# Patient Record
Sex: Male | Born: 1970 | Race: Black or African American | Hispanic: No | Marital: Single | Smoking: Never smoker
Health system: Southern US, Community
[De-identification: ages and names within clinical notes are randomized; demographics above are authoritative.]

## PROBLEM LIST (undated history)

## (undated) ENCOUNTER — Emergency Department (HOSPITAL_COMMUNITY): Admission: EM | Payer: Self-pay | Source: Home / Self Care

## (undated) ENCOUNTER — Emergency Department (HOSPITAL_COMMUNITY): Disposition: A | Discharge status: Home / Self Care | Payer: Self-pay

## (undated) DIAGNOSIS — I82409 Acute embolism and thrombosis of unspecified deep veins of unspecified lower extremity: Secondary | ICD-10-CM

## (undated) DIAGNOSIS — I10 Essential (primary) hypertension: Secondary | ICD-10-CM

---

## 2002-07-17 ENCOUNTER — Emergency Department (HOSPITAL_COMMUNITY): Admission: EM | Admit: 2002-07-17 | Discharge: 2002-07-17 | Payer: Self-pay | Admitting: Emergency Medicine

## 2002-07-17 ENCOUNTER — Encounter: Payer: Self-pay | Admitting: Emergency Medicine

## 2002-12-24 ENCOUNTER — Emergency Department (HOSPITAL_COMMUNITY): Admission: EM | Admit: 2002-12-24 | Discharge: 2002-12-24 | Payer: Self-pay | Admitting: Emergency Medicine

## 2003-10-30 ENCOUNTER — Emergency Department (HOSPITAL_COMMUNITY): Admission: EM | Admit: 2003-10-30 | Discharge: 2003-10-30 | Payer: Self-pay | Admitting: Family Medicine

## 2004-06-10 ENCOUNTER — Emergency Department (HOSPITAL_COMMUNITY): Admission: EM | Admit: 2004-06-10 | Discharge: 2004-06-10 | Payer: Self-pay | Admitting: Family Medicine

## 2004-06-20 ENCOUNTER — Emergency Department (HOSPITAL_COMMUNITY): Admission: EM | Admit: 2004-06-20 | Discharge: 2004-06-20 | Payer: Self-pay | Admitting: Family Medicine

## 2004-08-09 ENCOUNTER — Emergency Department (HOSPITAL_COMMUNITY): Admission: EM | Admit: 2004-08-09 | Discharge: 2004-08-09 | Payer: Self-pay | Admitting: Emergency Medicine

## 2004-08-18 ENCOUNTER — Ambulatory Visit: Payer: Self-pay | Admitting: Internal Medicine

## 2005-04-21 ENCOUNTER — Emergency Department (HOSPITAL_COMMUNITY): Admission: EM | Admit: 2005-04-21 | Discharge: 2005-04-21 | Payer: Self-pay | Admitting: Emergency Medicine

## 2005-05-29 ENCOUNTER — Emergency Department (HOSPITAL_COMMUNITY): Admission: EM | Admit: 2005-05-29 | Discharge: 2005-05-29 | Payer: Self-pay | Admitting: Emergency Medicine

## 2006-07-22 ENCOUNTER — Ambulatory Visit: Payer: Self-pay | Admitting: Family Medicine

## 2006-07-22 ENCOUNTER — Observation Stay (HOSPITAL_COMMUNITY): Admission: EM | Admit: 2006-07-22 | Discharge: 2006-07-23 | Payer: Self-pay | Admitting: Emergency Medicine

## 2006-10-30 ENCOUNTER — Emergency Department (HOSPITAL_COMMUNITY): Admission: EM | Admit: 2006-10-30 | Discharge: 2006-10-30 | Payer: Self-pay | Admitting: Emergency Medicine

## 2006-11-20 ENCOUNTER — Emergency Department (HOSPITAL_COMMUNITY): Admission: EM | Admit: 2006-11-20 | Discharge: 2006-11-20 | Payer: Self-pay | Admitting: Family Medicine

## 2007-04-07 ENCOUNTER — Emergency Department (HOSPITAL_COMMUNITY): Admission: EM | Admit: 2007-04-07 | Discharge: 2007-04-07 | Payer: Self-pay | Admitting: Emergency Medicine

## 2009-09-21 ENCOUNTER — Emergency Department (HOSPITAL_COMMUNITY): Admission: EM | Admit: 2009-09-21 | Discharge: 2009-09-21 | Payer: Self-pay | Admitting: Emergency Medicine

## 2009-10-21 ENCOUNTER — Emergency Department (HOSPITAL_COMMUNITY): Admission: EM | Admit: 2009-10-21 | Discharge: 2009-10-21 | Payer: Self-pay | Admitting: Emergency Medicine

## 2010-09-17 NOTE — Discharge Summary (Signed)
NAME:  Edward Barr, MADDY NO.:  [id number]   MEDICAL RECORD NO.:  [id number]          PATIENT TYPE:  OBV   LOCATION:  6741                         FACILITY:  [facility]   PHYSICIAN:  Dwana Curd. Para March, M.D. DATE OF BIRTH:  04/02/1971   DATE OF ADMISSION:  07/22/2006  DATE OF DISCHARGE:  07/23/2006                               DISCHARGE SUMMARY   DISCHARGE DIAGNOSIS:  Cocaine-induced chest pain.   DISCHARGE MEDICATIONS:  None.   FOLLOWUP:  Patient is to follow up with [facility].  He did not have a  primary care Edward Barr at time of admission, he was unassigned and wished  to follow up with [facility].  He voices understanding of the need for  followup.   At followup, patient needs to have his blood pressure checked.  He had  borderline LVH on EKG, this may be a normal variant of an athletic heart  in a young African-American male.  He also had an LDL of 111, this can  be followed up at [facility] for dietary modification.   PROCEDURES AND DIAGNOSTIC STUDIES:  EKG showing borderline LVH, no acute  ST changes.   CONSULTANTS:  None.   ADMISSION HISTORY AND PHYSICAL:  The patient is a 40 year old African-  American male with no past medical history who endorses cocaine use  several hours before admission.  He had left breast pain and shortness  of breath after using cocaine.  This subsequently resolved.  He was  given aspirin, but never required nitroglycerin.  At time of intake in  the emergency room by the family medical teaching service, he was chest  pain free and he was admitted for management and rule out MI with serial  enzymes and EKG.   HOSPITAL COURSE:  Patient was admitted as listed above.  He was stable  on telemetry overnight.  He had serial troponins that were negative.  He  had no significant EKG changes and he remained chest pain free.  He is  stable for followup.   DISCHARGE LABS:  Alcohol level negative.  UDS positive for cocaine.   Initial  point-of-care cardiac markers were negative.  First formal set  of cardiac markers showed a troponin of 0.02.  Followup troponin was  unchanged.   Discharge electrolytes:  Sodium 137, potassium 3.7, creatinine 0.99.   Lipids:  Cholesterol 170, triglycerides 83, HDL 42, LDL 111.   Hemoglobin 13.3, platelets 362.      Dwana Curd Para March, M.D.     GSD/MEDQ  D:  07/23/2006  T:  07/23/2006  Job:  [id number]   cc:   [facility]

## 2010-09-17 NOTE — H&P (Signed)
Edward Barr, Edward Barr                 ACCOUNT NO.:  [id number]   MEDICAL RECORD NO.:  [id number]          PATIENT TYPE:  OBV   LOCATION:  1830                         FACILITY:  [facility]   PHYSICIAN:  Leighton Roach McDiarmid, M.D.DATE OF BIRTH:  [birth year]   DATE OF ADMISSION:  07/22/2006  DATE OF DISCHARGE:                              HISTORY & PHYSICAL   CHIEF COMPLAINT:  Chest pain.   PRIMARY CARE PHYSICIAN:  None.   HISTORY OF PRESENT ILLNESS:  This is a 40 year old African American male  with no known past medical history who presents to the emergency  department for sudden onset left-sided, sharp, stabbing chest pain that  began around 11 this morning after getting out of the shower.  The pain  lasted until arrival in the ED, and receipt of aspirin around 2 p.m.  this afternoon.  The pain is resolved at the time of my exam.  The pain  was described as non-radiating, but it was associated with nausea,  diaphoresis, and shortness of breath.  It was worsened by swallowing and  taking deep breaths, and the patient's chest was tender to palpation  while the chest pain was ongoing, however, this has since resolved.  The  patient denies any prior episodes of chest pain similar to this.  He  does report occasional heartburn but this pain is different.  Of note,  the patient also admits to occasional snorting of cocaine with last use  being yesterday.  The patient denies any fever, chills, cough, recent  illness, vomiting, diarrhea, or abdominal pain.   REVIEW OF SYSTEMS:  The patient denies fever, chills, cough, sore  throat, congestion, denies headache and changes, numbness, tingling. He  denies any abdominal pain, vomiting or diarrhea.  He denies bright red  blood per rectum or melenic stools.  He denies dysuria or hematuria.  He  denies rash.   PAST MEDICAL HISTORY:  None.   PAST SURGICAL HISTORY:  None.   MEDICATIONS:  None.   ALLERGIES:  NO KNOWN DRUG ALLERGIES.  THE PATIENT DOES  REPORT  ANAPHYLACTIC REACTION TO GARLIC AND LEMON PEPPER.   FAMILY HISTORY:  The patient's mother had an MI in her 33s, also has  diabetes, hypertension.  The patient's father had an MI in his 25s, is  still living.  The patient's brother is alive and well.  The patient has  multiple grandparents who also had massive MIs.   SOCIAL HISTORY:  The patient currently lives with a male friend.  He  is unemployed.  Denies tobacco use.  Reports occasional alcohol use  socially with last drink being yesterday, which was one shot of vodka.  Admits to using cocaine several times per month with his last use also  being yesterday.  He reports snorting powder but denies any crack  rejection use.  The patient also denies any other recreational drug use.   PHYSICAL EXAMINATION:  VITAL SIGNS:  Temperature is 99.2, heart rate  106, respiratory rate 18, blood pressure 143/84, oxygen saturation 98-  100% on room air.  GENERAL:  The patient is awake,  alert, and in no acute distress.  HEENT:  Head is normocephalic and atraumatic.  Pupils are equal, round,  reactive to light and accommodation.  Extraocular movements are intact.  Ears are without discharge or deformity.  Oropharynx is non-erythematous  and moist mucous membranes.  Tympanic membranes are clear bilaterally.  NECK:  Supple, nontender and no cervical, supraclavicular or occipital  lymphadenopathy.  CARDIOVASCULAR:  Heart is regular rate and rhythm with no murmurs, rubs  or gallops.  The patient has two plus dorsalis pedis pulses bilaterally,  with a normally placed PMI, and brisk cap refill.  LUNGS:  Clear to auscultation bilaterally with normal work of breathing  and good air movement.  ABDOMEN:  Normoactive bowel sounds, soft, nontender, nondistended.  EXTREMITIES:  Have no clubbing, cyanosis or edema are warm and well  perfused.  NEUROLOGIC:  Cranial nerves II-XII are grossly intact.  The patient has  5/5 strength bilaterally in his  extremities.  Cerebellar function is  intact.  SKIN:  No rash or petechiae.  RECTAL:  The patient refuses until after eating.   LABORATORY DATA:  Point of care, revealed a myoglobin of 58.5, CK-MB of  less than 1, troponin of less than 0.05.  Urine drug screen is positive  for cocaine but otherwise negative.  Alcohol level is less than 5.  I-  STAT reveals a sodium of 137, potassium of 3.9, chloride of 104, bicarb  25.8, BUN of 12, creatinine of 1, glucose of 87.  Hemoglobin is 15.3 and  hematocrit 45.0 on I-STAT.   Chest x-ray shows no acute cardiopulmonary disease.   EKG shows normal sinus rhythm with no acute ST or T-wave changes but  presence of left ventricular hypertrophy.   ASSESSMENT:  This is a 40 year old African American male with no known  medical history who presents with first episode of sudden onset left-  sided chest pain.   PLAN:  1. Chest pain.  It is left-sided, now resolved.  However, given recent      cocaine use and family history is concerning somewhat for cardiac      origin.  First set of cardiac enzymes are negative and EKG is      without evidence of acute ischemia.  Thus coronary vasospasm      secondary to cocaine or esophageal spasm versus heartburn are also      possible.  We will admit the patient for 23-hour observation to      rule out MI.  We will obtain cardiac panel now and again in the      morning, 12 hours later, along with a repeat EKG in a.m.  We will      also obtain fasting lipid panel in the morning to risk stratify.      We will start aspirin 81 mg p.o. daily as the patient has already      received aspirin load in the ED.  We will avoid beta blocker use      given positive urine drug screen for cocaine.  We will give      nitroglycerin p.r.n. for chest pain.  2. Blood pressure.  The patient has a single mildly elevated blood      pressure in the emergency department while he was in pain.  The     patient has no history of  hypertension, though has not been to a      doctor in years.  We will continue to monitor blood pressures  throughout admission and recommend establishing with a primary care      physician upon discharge.  3. FEN/GI:  We will place the patient on a heart-healthy diet as      tolerated.  We will KVO IV fluids.  4. Prophylaxis.  We will place the patient on SCDs.   DISPOSITION:  Pending cardiac rule out.  We will offer to establish the  patient at [facility] or [facility] for primary care physician at the  time of discharge.     ______________________________  Drue Dun, M.D.    ______________________________  Leighton Roach McDiarmid, M.D.    EE/MEDQ  D:  07/22/2006  T:  07/22/2006  Job:  [id number]

## 2013-12-24 ENCOUNTER — Emergency Department (HOSPITAL_COMMUNITY)
Admission: EM | Admit: 2013-12-24 | Discharge: 2013-12-24 | Disposition: A | Discharge status: Home / Self Care | Payer: Self-pay | Attending: Emergency Medicine | Admitting: Emergency Medicine

## 2013-12-24 ENCOUNTER — Encounter (HOSPITAL_COMMUNITY): Payer: Self-pay | Admitting: Emergency Medicine

## 2013-12-24 DIAGNOSIS — K029 Dental caries, unspecified: Secondary | ICD-10-CM | POA: Insufficient documentation

## 2013-12-24 DIAGNOSIS — K089 Disorder of teeth and supporting structures, unspecified: Secondary | ICD-10-CM | POA: Insufficient documentation

## 2013-12-24 DIAGNOSIS — K047 Periapical abscess without sinus: Secondary | ICD-10-CM | POA: Insufficient documentation

## 2013-12-24 DIAGNOSIS — K002 Abnormalities of size and form of teeth: Secondary | ICD-10-CM | POA: Insufficient documentation

## 2013-12-24 MED ORDER — HYDROCODONE-ACETAMINOPHEN 5-325 MG PO TABS
1.0000 | ORAL_TABLET | Freq: Once | ORAL | Status: AC
  Administered 2013-12-24: 1 via ORAL
  Filled 2013-12-24: qty 1

## 2013-12-24 MED ORDER — IBUPROFEN 800 MG PO TABS: 800.0000 mg | ORAL_TABLET | Freq: Three times a day (TID) | ORAL | Status: DC | PRN

## 2013-12-24 MED ORDER — HYDROCODONE-ACETAMINOPHEN 5-325 MG PO TABS: 1.0000 | ORAL_TABLET | Freq: Four times a day (QID) | ORAL | Status: DC | PRN

## 2013-12-24 MED ORDER — PENICILLIN V POTASSIUM 500 MG PO TABS: 500.0000 mg | ORAL_TABLET | Freq: Four times a day (QID) | ORAL | Status: DC

## 2013-12-24 NOTE — ED Notes (Signed)
C/o of dental pain to left lower mouth x 3 days

## 2013-12-24 NOTE — Discharge Instructions (Signed)
Return here as needed.  Call the dentist provided for followup.  There was no oral surgeon on call today for the emergency department.rinse with warm water and peroxide 3 times a day

## 2013-12-24 NOTE — ED Provider Notes (Signed)
CSN: [id number]     Arrival date & time 12/24/13  0913 History   First MD Initiated Contact with Patient 12/24/13 1005     Chief Complaint  Patient presents with  . Dental Pain     (Consider location/radiation/quality/duration/timing/severity/associated sxs/prior Treatment) HPI Patient presents to the emergency department with dental pain to the left lower dentition for the last 3 days.  The patient, states, that his lower left back molar has been bothering him with swelling and bleeding from the area.  The patient, states, that he has been rinsing with Listerine for the last few days.  Patient, states, that he has not any fever, nausea, vomiting, weakness, throat swelling. Or syncope.  States, that he does not have a dentist History reviewed. No pertinent past medical history. History reviewed. No pertinent past surgical history. No family history on file. History  Substance Use Topics  . Smoking status: Never Smoker   . Smokeless tobacco: Not on file  . Alcohol Use: No    Review of Systems  All other systems negative except as documented in the HPI. All pertinent positives and negatives as reviewed in the HPI.   Allergies  Review of patient's allergies indicates no known allergies.  Home Medications   Prior to Admission medications   Not on File   BP 122/75  Pulse 60  Temp(Src) 98 F (36.7 C) (Oral)  Resp 16  Wt 156 lb (70.761 kg)  SpO2 99% Physical Exam  Nursing note and vitals reviewed. Constitutional: He is oriented to person, place, and time. He appears well-developed and well-nourished. No distress.  HENT:  Head: Normocephalic and atraumatic.  Mouth/Throat: Uvula is midline. No trismus in the jaw. Abnormal dentition. Dental abscesses and dental caries present. No uvula swelling. No posterior oropharyngeal edema or posterior oropharyngeal erythema.    Neck: Normal range of motion. Neck supple. No muscular tenderness present. No rigidity. No edema and normal  range of motion present.  Pulmonary/Chest: Effort normal.  Neurological: He is alert and oriented to person, place, and time.  Skin: Skin is warm and dry.    ED Course  Procedures (including critical care time)   Patient will be referred to the oral surgeon and dentist for further evaluation and care.  Patient is advised return here as needed.  Advised to rinse with warm water and peroxide 3 times a day    Carlyle Dolly, PA-C 12/24/13 1054

## 2013-12-27 NOTE — ED Provider Notes (Signed)
Medical screening examination/treatment/procedure(s) were performed by non-physician practitioner and as supervising physician I was immediately available for consultation/collaboration.   Krzysztof Reichelt T Caeden Foots, MD 12/27/13 0741 

## 2014-01-25 ENCOUNTER — Emergency Department (HOSPITAL_COMMUNITY)
Admission: EM | Admit: 2014-01-25 | Discharge: 2014-01-25 | Disposition: A | Discharge status: Home / Self Care | Payer: Self-pay | Attending: Emergency Medicine | Admitting: Emergency Medicine

## 2014-01-25 ENCOUNTER — Encounter (HOSPITAL_COMMUNITY): Payer: Self-pay | Admitting: Emergency Medicine

## 2014-01-25 DIAGNOSIS — K0889 Other specified disorders of teeth and supporting structures: Secondary | ICD-10-CM

## 2014-01-25 DIAGNOSIS — Z792 Long term (current) use of antibiotics: Secondary | ICD-10-CM | POA: Insufficient documentation

## 2014-01-25 DIAGNOSIS — K089 Disorder of teeth and supporting structures, unspecified: Secondary | ICD-10-CM | POA: Insufficient documentation

## 2014-01-25 MED ORDER — PENICILLIN V POTASSIUM 500 MG PO TABS
500.0000 mg | ORAL_TABLET | Freq: Once | ORAL | Status: AC
  Administered 2014-01-25: 500 mg via ORAL
  Filled 2014-01-25: qty 1

## 2014-01-25 MED ORDER — PROMETHAZINE HCL 25 MG PO TABS: 25.0000 mg | ORAL_TABLET | Freq: Four times a day (QID) | ORAL | Status: DC | PRN

## 2014-01-25 MED ORDER — HYDROCODONE-ACETAMINOPHEN 5-325 MG PO TABS: 1.0000 | ORAL_TABLET | Freq: Four times a day (QID) | ORAL | Status: DC | PRN

## 2014-01-25 MED ORDER — PENICILLIN V POTASSIUM 500 MG PO TABS: 500.0000 mg | ORAL_TABLET | Freq: Four times a day (QID) | ORAL | Status: DC

## 2014-01-25 MED ORDER — HYDROCODONE-ACETAMINOPHEN 5-325 MG PO TABS
2.0000 | ORAL_TABLET | Freq: Once | ORAL | Status: AC
  Administered 2014-01-25: 2 via ORAL
  Filled 2014-01-25: qty 2

## 2014-01-25 MED ORDER — ONDANSETRON 8 MG PO TBDP
8.0000 mg | ORAL_TABLET | Freq: Once | ORAL | Status: AC
  Administered 2014-01-25: 8 mg via ORAL
  Filled 2014-01-25: qty 1

## 2014-01-25 NOTE — Discharge Instructions (Signed)
Dental Pain °A tooth ache may be caused by cavities (tooth decay). Cavities expose the nerve of the tooth to air and hot or cold temperatures. It may come from an infection or abscess (also called a boil or furuncle) around your tooth. It is also often caused by dental caries (tooth decay). This causes the pain you are having. °DIAGNOSIS  °Your caregiver can diagnose this problem by exam. °TREATMENT  °· If caused by an infection, it may be treated with medications which kill germs (antibiotics) and pain medications as prescribed by your caregiver. Take medications as directed. °· Only take over-the-counter or prescription medicines for pain, discomfort, or fever as directed by your caregiver. °· Whether the tooth ache today is caused by infection or dental disease, you should see your dentist as soon as possible for further care. °SEEK MEDICAL CARE IF: °The exam and treatment you received today has been provided on an emergency basis only. This is not a substitute for complete medical or dental care. If your problem worsens or new problems (symptoms) appear, and you are unable to meet with your dentist, call or return to this location. °SEEK IMMEDIATE MEDICAL CARE IF:  °· You have a fever. °· You develop redness and swelling of your face, jaw, or neck. °· You are unable to open your mouth. °· You have severe pain uncontrolled by pain medicine. °MAKE SURE YOU:  °· Understand these instructions. °· Will watch your condition. °· Will get help right away if you are not doing well or get worse. °Document Released: 04/18/2005 Document Revised: 07/11/2011 Document Reviewed: 12/05/2007 °Exit[employer]® Patient Information ©2015 [employer]. This information is not intended to replace advice given to you by your health care provider. Make sure you discuss any questions you have with your health care provider. °Return to the emergency room for fever, change in vision, redness to the face that rapidly spreads towards the eye,  nausea or vomiting, difficulty swallowing or shortness of breath. ° °You may follow with the dentist listed above. Alternatively,  you may also contact [facility] who run a low-cost dental clinic at their phone number is [phone]. You may also call [phone] ° °Dental Assistance °If the dentist on-call cannot see you, please use the resources below: ° ° °Patients with Medicaid: [facility] °[address], [phone] °[address], [phone] ° °If unable to pay, or uninsured, contact [facility] ([phone]) or [facility] ([phone] in [place], [phone] in [place]) to become qualified for the adult dental clinic ° °Other Low-Cost Community Dental Services: °[facility]- [address], [place], [place], [address] °   [phone], Ext. [phone] °   2nd and 4th Thursday of the month at 6:30am °   10 clients each day by appointment, can sometimes see walk-in     patients if someone does not show for an appointment °[facility]- [address], [place], [place], [address] °   [phone] °[facility]- [address], [place], [place], [address] °   [phone] ° °[facility]- [phone] °[facility]- [phone] °[facility]- [phone] ° °

## 2014-01-25 NOTE — ED Provider Notes (Signed)
CSN: [id number]     Arrival date & time 01/25/14  1229 History   First MD Initiated Contact with Patient 01/25/14 1255     Chief Complaint  Patient presents with  . Dental Pain     (Consider location/radiation/quality/duration/timing/severity/associated sxs/prior Treatment) HPI Comments: Patient is a 43 year old male who presents emergency department for evaluation of dental pain. He reports that he was initially seen for the same dental pain one month ago. He was given penicillin and a pain remainder. Initially this improved his symptoms, but he was unable to follow up with a dentist due to financial reasons. He tried to go to the free dental clinic, but arrived too late and was not able to be seen. One week ago he has been having gradually worsening left lower dental pain. He describes the pain as throbbing. He has taken [facility] powder without relief of his symptoms. He denies fever, chills, nausea, vomiting, abdominal pain, trismus, shortness of breath.  Patient is a 43 y.o. male presenting with tooth pain. The history is provided by the patient. No language interpreter was used.  Dental Pain Associated symptoms: no fever     History reviewed. No pertinent past medical history. History reviewed. No pertinent past surgical history. No family history on file. History  Substance Use Topics  . Smoking status: Never Smoker   . Smokeless tobacco: Not on file  . Alcohol Use: No    Review of Systems  Constitutional: Negative for fever and chills.  HENT: Positive for dental problem.   Gastrointestinal: Negative for nausea, vomiting and abdominal pain.  All other systems reviewed and are negative.     Allergies  Review of patient's allergies indicates no known allergies.  Home Medications   Prior to Admission medications   Medication Sig Start Date End Date Taking? Authorizing Provider  HYDROcodone-acetaminophen (NORCO/VICODIN) 5-325 MG per tablet Take 1 tablet by mouth every 6 (six)  hours as needed for moderate pain. 12/24/13   Carlyle Dolly, PA-C  HYDROcodone-acetaminophen (NORCO/VICODIN) 5-325 MG per tablet Take 1-2 tablets by mouth every 6 (six) hours as needed for moderate pain or severe pain. 01/25/14   Mora Bellman, PA-C  ibuprofen (ADVIL,MOTRIN) 200 MG tablet Take 200 mg by mouth every 4 (four) hours as needed for moderate pain.    Historical Provider, MD  ibuprofen (ADVIL,MOTRIN) 800 MG tablet Take 1 tablet (800 mg total) by mouth every 8 (eight) hours as needed. 12/24/13   Jamesetta Orleans Lawyer, PA-C  naproxen sodium (ANAPROX) 220 MG tablet Take 220 mg by mouth once as needed (dental pain.).    Historical Provider, MD  penicillin v potassium (VEETID) 500 MG tablet Take 1 tablet (500 mg total) by mouth 4 (four) times daily. 12/24/13   Jamesetta Orleans Lawyer, PA-C  penicillin v potassium (VEETID) 500 MG tablet Take 1 tablet (500 mg total) by mouth 4 (four) times daily. 01/25/14   Mora Bellman, PA-C  promethazine (PHENERGAN) 25 MG tablet Take 1 tablet (25 mg total) by mouth every 6 (six) hours as needed for nausea or vomiting. 01/25/14   Mora Bellman, PA-C   BP 123/76  Pulse 56  Temp(Src) 97.9 F (36.6 C) (Oral)  Resp 18  SpO2 98% Physical Exam  Nursing note and vitals reviewed. Constitutional: He is oriented to person, place, and time. He appears well-developed and well-nourished. No distress.  HENT:  Head: Normocephalic and atraumatic.  Right Ear: External ear normal.  Left Ear: External ear normal.  Nose: Nose  normal.  No trismus, submental edema, tongue elevation Tender to palpation to left lower molar.  No drainable abscess.   Eyes: Conjunctivae are normal.  Neck: Normal range of motion. No tracheal deviation present.  Cardiovascular: Normal rate, regular rhythm and normal heart sounds.   Pulmonary/Chest: Effort normal and breath sounds normal. No stridor.  Abdominal: Soft. He exhibits no distension. There is no tenderness.  Musculoskeletal:  Normal range of motion.  Neurological: He is alert and oriented to person, place, and time.  Skin: Skin is warm and dry. He is not diaphoretic.  Psychiatric: He has a normal mood and affect. His behavior is normal.    ED Course  Procedures (including critical care time) Labs Review Labs Reviewed - No data to display  Imaging Review No results found.   EKG Interpretation None      MDM   Final diagnoses:  Pain, dental   Patient with toothache.  No gross abscess.  Exam unconcerning for Ludwig's angina or spread of infection.  Will treat with penicillin and pain medicine.  Urged patient to follow-up with dentist.    Mora Bellman, PA-C 01/25/14 1319

## 2014-01-25 NOTE — ED Provider Notes (Signed)
Medical screening examination/treatment/procedure(s) were performed by non-physician practitioner and as supervising physician I was immediately available for consultation/collaboration.   EKG Interpretation None       Gilda Crease, MD 01/25/14 1324

## 2014-01-25 NOTE — ED Notes (Signed)
Pt reports having left, posterior, mandibular dental pain that has started on week ago. Pt also states that he has an abscess at the site. Pt states that he was seen here about one month ago for the same, however hasn't followed up with a dentist due to financial reasons. Pt is A/O x4, in NAD, and vitals are WDL.

## 2014-02-20 ENCOUNTER — Emergency Department (INDEPENDENT_AMBULATORY_CARE_PROVIDER_SITE_OTHER)
Admission: EM | Admit: 2014-02-20 | Discharge: 2014-02-20 | Disposition: A | Discharge status: Home / Self Care | Payer: Self-pay | Source: Home / Self Care | Attending: Emergency Medicine | Admitting: Emergency Medicine

## 2014-02-20 ENCOUNTER — Encounter (HOSPITAL_COMMUNITY): Payer: Self-pay | Admitting: Emergency Medicine

## 2014-02-20 DIAGNOSIS — K0401 Reversible pulpitis: Secondary | ICD-10-CM

## 2014-02-20 DIAGNOSIS — K040 Pulpitis: Secondary | ICD-10-CM

## 2014-02-20 MED ORDER — HYDROCODONE-ACETAMINOPHEN 5-325 MG PO TABS: ORAL_TABLET | ORAL | Status: DC

## 2014-02-20 MED ORDER — METRONIDAZOLE 500 MG PO TABS
500.0000 mg | ORAL_TABLET | Freq: Three times a day (TID) | ORAL | Status: DC
Start: 2014-02-20 — End: 2014-04-10

## 2014-02-20 MED ORDER — AMOXICILLIN 500 MG PO CAPS: 500.0000 mg | ORAL_CAPSULE | Freq: Three times a day (TID) | ORAL | Status: DC

## 2014-02-20 MED ORDER — HYDROCODONE-ACETAMINOPHEN 5-325 MG PO TABS: 2.0000 | ORAL_TABLET | Freq: Once | ORAL | Status: DC

## 2014-02-20 MED ORDER — HYDROCODONE-ACETAMINOPHEN 5-325 MG PO TABS
2.0000 | ORAL_TABLET | Freq: Once | ORAL | Status: DC
  Administered 2014-02-20: 2 via ORAL

## 2014-02-20 MED ORDER — HYDROCODONE-ACETAMINOPHEN 5-325 MG PO TABS
1.0000 | ORAL_TABLET | Freq: Once | ORAL | Status: AC
  Administered 2014-02-20: 1 via ORAL

## 2014-02-20 MED ORDER — HYDROCODONE-ACETAMINOPHEN 5-325 MG PO TABS
ORAL_TABLET | ORAL | Status: AC
  Filled 2014-02-20: qty 2

## 2014-02-20 MED ORDER — NAPROXEN 500 MG PO TABS: 500.0000 mg | ORAL_TABLET | Freq: Two times a day (BID) | ORAL | Status: DC

## 2014-02-20 NOTE — ED Notes (Signed)
Patient c/o toothache on left upper side and right lower side x 2 days. Patient states he does not have a dentist. Patient also reports swelling around affected teeth. Has been taking BC powder and Ibuprofen with mild relief. Patient is alert and in NAD.

## 2014-02-20 NOTE — ED Provider Notes (Signed)
Chief Complaint   Dental Pain   History of Present Illness   Laurene FootmanJason Minch is a 43 year old male who has had a two-day history of pain in his right, lower first molar and his left, upper first premolar. The teeth are decayed for a while. He notes swelling of the gingiva and some swelling externally particularly in the left maxillary area. It hurts to open his mouth but he is unable to open fully. It also hurts to chew. He has no trouble swallowing or breathing. No fevers, chills, headache, neck pain or stiffness, chest pain, or shortness of breath.  Review of Systems   Other than as noted above, the patient denies any of the following symptoms: Systemic:  No fever or chills. ENT:  No headache, ear ache, sore throat, nasal congestion, facial pain, or swelling. Neck:  No adenopathy or neck swelling. Lungs:  No coughing or shortness of breath.  PMFSH   Past medical history, family history, social history, meds, and allergies were reviewed.   Physical Examination     Vital signs:  BP 125/85  Pulse 63  Temp(Src) 98.1 F (36.7 C) (Oral)  Resp 16  SpO2 98% General:  Alert, oriented, in no distress. ENT:  TMs and canals normal.  Nasal mucosa normal. Mouth exam:  There is widespread dental decay. His right, lower first molar and left, upper first premolar are markedly decayed and tender to touch. There is no visible swelling of the gingiva, no collection of pus, no swelling of the tongue or the floor the mouth. The pharynx is clear the airway is widely patent. Neck:  No swelling or adenopathy. Lungs:  Breath sounds clear and equal bilaterally.  No wheezes, rales or rhonchi. Heart:  Regular rhythm.  No gallops or murmers. Skin:  Clear, warm and dry.    Course in Urgent Care Center   The following medications were given:  Medications  HYDROcodone-acetaminophen (NORCO/VICODIN) 5-325 MG per tablet 1 tablet (1 tablet Oral Given 02/20/14 1532)   Assessment   The encounter diagnosis  was Pulpitis.  No evidence of Ludwig's angina.    Plan   1.  Meds:  The following meds were prescribed:   Discharge Medication List as of 02/20/2014  3:08 PM    START taking these medications   Details  amoxicillin (AMOXIL) 500 MG capsule Take 1 capsule (500 mg total) by mouth 3 (three) times daily., Starting 02/20/2014, Until Discontinued, Normal    !! HYDROcodone-acetaminophen (NORCO/VICODIN) 5-325 MG per tablet 1 to 2 tabs every 4 to 6 hours as needed for pain., Print    metroNIDAZOLE (FLAGYL) 500 MG tablet Take 1 tablet (500 mg total) by mouth 3 (three) times daily., Starting 02/20/2014, Until Discontinued, Normal    naproxen (NAPROSYN) 500 MG tablet Take 1 tablet (500 mg total) by mouth 2 (two) times daily., Starting 02/20/2014, Until Discontinued, Normal     !! - Potential duplicate medications found. Please discuss with provider.      2.  Patient Education/Counseling:  The patient was given appropriate handouts, self care instructions, and instructed in pain control. Suggested sleeping with head of bed elevated and hot salt water mouthwash.   3.  Follow up:  The patient was told to follow up if no better in 3 to 4 days, if becoming worse in any way, and given some red flag symptoms such as difficulty swallowing or breathing which would prompt immediate return.  Follow up with a dentist as soon as posssible.  Reuben Likesavid C Eudell Mcphee, MD 02/20/14 [phone]

## 2014-02-20 NOTE — Discharge Instructions (Signed)
Look up the [employer] for free dental clinics. Http://www.ncdental.org/ncds/Schedule.asp ° °Get there early and be prepared to wait. [employer] and [employer] have dental hygienist schools that provide low cost routine dental care.  ° °Other resources: °[facility] °[address] °[place], [place] °[phone] ° °Patients with Medicaid: °[facility]                     [facility] °[address]                                [address] °Phone:  [phone]                                                  Phone:  [phone] ° °Dr. Janice Civils °[address] °[phone] ° °If unable to pay or uninsured, contact:  [facility] or [facility] to become qualified for the adult dental clinic. ° °No matter what dental problem you have, it will not get better unless you get good dental care.  If the tooth is not taken care of, your symptoms will come back in time and you will be visiting us again in the Urgent Care Center with a bad toothache.  So, see your dentist as soon as possible.  If you don't have a dentist, we can give you a list of dentists.  Sometimes the most cost effective treatment is removal of the tooth.  This can be done very inexpensively through one of the low cost [facility] such as the facility on [address] in [place] ([phone]).  The downside to this is that you will have one less tooth and this can effect your ability to chew. ° °Some other things that can be done for a dental infection include the following: ° °· Rinse your mouth out with hot salt water (1/2 tsp of table salt and a pinch of baking soda in 8 oz of hot water).  You can do this every 2 or 3 hours. °· Avoid cold foods, beverages, and cold air.  This will make your symptoms worse. °· Sleep with your head elevated.  Sleeping flat will cause your gums and oral tissues to swell and make them hurt  more.  You can sleep on several pillows.  Even better is to sleep in a recliner with your head higher than your heart. °· For mild to moderate pain, you can take Tylenol, ibuprofen, or Aleve. °· External application of heat by a heating pad, hot water bottle, or hot wet towel can help with pain and speed healing.  You can do this every 2 to 3 hours. Do not fall asleep on a heating pad since this can cause a burn.  °·  °

## 2014-03-05 ENCOUNTER — Encounter (HOSPITAL_COMMUNITY): Payer: Self-pay

## 2014-03-05 ENCOUNTER — Emergency Department (INDEPENDENT_AMBULATORY_CARE_PROVIDER_SITE_OTHER)
Admission: EM | Admit: 2014-03-05 | Discharge: 2014-03-05 | Disposition: A | Discharge status: Home / Self Care | Payer: Self-pay | Source: Home / Self Care | Attending: Emergency Medicine | Admitting: Emergency Medicine

## 2014-03-05 DIAGNOSIS — K040 Pulpitis: Secondary | ICD-10-CM

## 2014-03-05 DIAGNOSIS — K0401 Reversible pulpitis: Secondary | ICD-10-CM

## 2014-03-05 MED ORDER — AMOXICILLIN 500 MG PO CAPS: 500.0000 mg | ORAL_CAPSULE | Freq: Three times a day (TID) | ORAL | Status: DC

## 2014-03-05 MED ORDER — TRIAMCINOLONE ACETONIDE 0.1 % EX CREA: 1.0000 "application " | TOPICAL_CREAM | Freq: Three times a day (TID) | CUTANEOUS | Status: DC

## 2014-03-05 MED ORDER — HYDROCODONE-ACETAMINOPHEN 5-325 MG PO TABS: ORAL_TABLET | ORAL | Status: DC

## 2014-03-05 NOTE — Discharge Instructions (Signed)
Look up the [employer] for free dental clinics. Http://www.ncdental.org/ncds/Schedule.asp ° °Get there early and be prepared to wait. [employer] and [employer] have dental hygienist schools that provide low cost routine dental care.  ° °Other resources: °[facility] °[address] °[place], [place] °[phone] ° °Patients with Medicaid: °[facility]                     [facility] °[address]                                [address] °Phone:  [phone]                                                  Phone:  [phone] ° °Dr. Janice Civils °[address] °[phone] ° °If unable to pay or uninsured, contact:  [facility] or [facility] to become qualified for the adult dental clinic. ° °No matter what dental problem you have, it will not get better unless you get good dental care.  If the tooth is not taken care of, your symptoms will come back in time and you will be visiting us again in the Urgent Care Center with a bad toothache.  So, see your dentist as soon as possible.  If you don't have a dentist, we can give you a list of dentists.  Sometimes the most cost effective treatment is removal of the tooth.  This can be done very inexpensively through one of the low cost [facility] such as the facility on [address] in [place] (1-800-336-8873).  The downside to this is that you will have one less tooth and this can effect your ability to chew. ° °Some other things that can be done for a dental infection include the following: ° °· Rinse your mouth out with hot salt water (1/2 tsp of table salt and a pinch of baking soda in 8 oz of hot water).  You can do this every 2 or 3 hours. °· Avoid cold foods, beverages, and cold air.  This will make your symptoms worse. °· Sleep with your head elevated.  Sleeping flat will cause your gums and oral tissues to swell and make them hurt  more.  You can sleep on several pillows.  Even better is to sleep in a recliner with your head higher than your heart. °· For mild to moderate pain, you can take Tylenol, ibuprofen, or Aleve. °· External application of heat by a heating pad, hot water bottle, or hot wet towel can help with pain and speed healing.  You can do this every 2 to 3 hours. Do not fall asleep on a heating pad since this can cause a burn.  °·  °

## 2014-03-05 NOTE — ED Notes (Signed)
C/o toothache on left side and rash

## 2014-03-05 NOTE — ED Provider Notes (Signed)
Chief Complaint   Medication Refill   History of Present Illness   Laurene FootmanJason Overbeck is a 43 year old male who has been here 4 times in the last 3 months, all for the same thing, dental infections. He has 3 very carious teeth in his mouth, and the pain seems to alternate from one tooth to another. He was here August 25, September 26, and October 22. He was essentially treated with the same medications each time, namely antibiotic and pain medications. He's not been able to get in to see a dentist, because he has no insurance and no Medicaid. Right now his left, upper, first premolar is bothering him the most. It's painful to touch, painful to chew, but he has no difficulty opening his mouth. He denies any difficulty swallowing or breathing. He's had no fever, chills, headache, neck pain or swelling, or chest pain or shortness of breath.  His other complaint has been some itchy bumps on his arms and legs which occur after he is out doing yard work. No one else in the house has a similar rash.  Review of Systems   Other than as noted above, the patient denies any of the following symptoms: Systemic:  No fever or chills. ENT:  No headache, ear ache, sore throat, nasal congestion, facial pain, or swelling. Neck:  No adenopathy or neck swelling. Lungs:  No coughing or shortness of breath.  PMFSH   Past medical history, family history, social history, meds, and allergies were reviewed.   Physical Examination     Vital signs:  BP 126/75 mmHg  Pulse 70  Temp(Src) 97.9 F (36.6 C) (Oral)  SpO2 97% General:  Alert, oriented, in no distress. ENT:  TMs and canals normal.  Nasal mucosa normal. Mouth exam:  He has widespread dental decay. Multiple carious and missing teeth. The tooth in question is the left, upper, first premolar. This is decayed down to the gumline, there is just a small shell of the tooth remaining. There is no swelling of the gingiva, no collection of pus. It's tender to palpation.  There is no swelling of the floor the mouth or the tongue, pharynx is clear and airway is widely patent. Neck:  No swelling or adenopathy. Lungs:  Breath sounds clear and equal bilaterally.  No wheezes, rales or rhonchi. Heart:  Regular rhythm.  No gallops or murmers. Skin:  Clear, warm and dry.   Assessment   The encounter diagnosis was Pulpitis.  No evidence of Ludwig's angina.  He'll need to see a dentist, unfortunately he does not have adequate insurance. We talked at length with him about other resources both for primary care and for dental care. I [place] database for controlled substances, and there is no evidence of overuse or or prescription of controlled medications.  Plan   1.  Meds:  The following meds were prescribed:   New Prescriptions   AMOXICILLIN (AMOXIL) 500 MG CAPSULE    Take 1 capsule (500 mg total) by mouth 3 (three) times daily.   AMOXICILLIN (AMOXIL) 500 MG CAPSULE    Take 1 capsule (500 mg total) by mouth 3 (three) times daily.   HYDROCODONE-ACETAMINOPHEN (NORCO/VICODIN) 5-325 MG PER TABLET    1 to 2 tabs every 4 to 6 hours as needed for pain.   TRIAMCINOLONE CREAM (KENALOG) 0.1 %    Apply 1 application topically 3 (three) times daily.    2.  Patient Education/Counseling:  The patient was given appropriate handouts, self care instructions,  and instructed in pain control. Suggested sleeping with head of bed elevated and hot salt water mouthwash.   3.  Follow up:  The patient was told to follow up if no better in 3 to 4 days, if becoming worse in any way, and given some red flag symptoms such as difficulty swallowing or breathing which would prompt immediate return.  Follow up with a dentist as soon as posssible.     Reuben Likesavid C Neythan Kozlov, MD 03/05/14 [phone]

## 2014-04-10 ENCOUNTER — Encounter (HOSPITAL_COMMUNITY): Payer: Self-pay | Admitting: *Deleted

## 2014-04-10 ENCOUNTER — Emergency Department (INDEPENDENT_AMBULATORY_CARE_PROVIDER_SITE_OTHER)
Admission: EM | Admit: 2014-04-10 | Discharge: 2014-04-10 | Disposition: A | Discharge status: Home / Self Care | Payer: Self-pay | Source: Home / Self Care | Attending: Family Medicine | Admitting: Family Medicine

## 2014-04-10 DIAGNOSIS — M545 Low back pain, unspecified: Secondary | ICD-10-CM

## 2014-04-10 DIAGNOSIS — K088 Other specified disorders of teeth and supporting structures: Secondary | ICD-10-CM

## 2014-04-10 DIAGNOSIS — K0889 Other specified disorders of teeth and supporting structures: Secondary | ICD-10-CM

## 2014-04-10 MED ORDER — TRAMADOL HCL 50 MG PO TABS: 50.0000 mg | ORAL_TABLET | Freq: Four times a day (QID) | ORAL | Status: DC | PRN

## 2014-04-10 MED ORDER — CLINDAMYCIN HCL 300 MG PO CAPS
300.0000 mg | ORAL_CAPSULE | Freq: Three times a day (TID) | ORAL | Status: DC
Start: 2014-04-10 — End: 2014-04-24

## 2014-04-10 MED ORDER — HYDROCODONE-ACETAMINOPHEN 5-325 MG PO TABS: 1.0000 | ORAL_TABLET | Freq: Four times a day (QID) | ORAL | Status: DC | PRN

## 2014-04-10 MED ORDER — DICLOFENAC SODIUM 75 MG PO TBEC: 75.0000 mg | DELAYED_RELEASE_TABLET | Freq: Two times a day (BID) | ORAL | Status: DC | PRN

## 2014-04-10 NOTE — ED Provider Notes (Signed)
Edward Barr is a 43 y.o. male who presents to Urgent Care today for tooth pain and back pain.  1) tooth pain present for 3 days. Patient has a history of pain in the left side of his mouth off and on for several months. He is scheduled to see a dentist in January. He finished his amoxicillin course weeks ago. He additionally is out of hydrocodone. [facility] ibuprofen which helps. No fevers or chills vomiting or diarrhea.  Low back pain. Patient additionally notes left-sided low back pain. The pain occurred after patient stood up from a squatted position doing yard work. He denies any radiating pain weakness or numbness no bowel bladder dysfunction. Patient has pain with ambulation. He's tried a leftover lidocaine patch which helped. He additionally has tried some over-the-counter medications which helped a little.   History reviewed. No pertinent past medical history. History reviewed. No pertinent past surgical history. History  Substance Use Topics  . Smoking status: Never Smoker   . Smokeless tobacco: Not on file  . Alcohol Use: No   ROS as above Medications: No current facility-administered medications for this encounter.   Current Outpatient Prescriptions  Medication Sig Dispense Refill  . Aspirin-Salicylamide-Caffeine (BC HEADACHE POWDER PO) Take 0.5 packets by mouth every 6 (six) hours as needed (for pain).    . clindamycin (CLEOCIN) 300 MG capsule Take 1 capsule (300 mg total) by mouth 3 (three) times daily. 30 capsule 0  . diclofenac (VOLTAREN) 75 MG EC tablet Take 1 tablet (75 mg total) by mouth 2 (two) times daily as needed. 60 tablet 0  . HYDROcodone-acetaminophen (NORCO/VICODIN) 5-325 MG per tablet Take 1 tablet by mouth every 6 (six) hours as needed. 15 tablet 0  . triamcinolone cream (KENALOG) 0.1 % Apply 1 application topically 3 (three) times daily. 60 g 3  . [DISCONTINUED] promethazine (PHENERGAN) 25 MG tablet Take 1 tablet (25 mg total) by mouth every 6 (six)  hours as needed for nausea or vomiting. 12 tablet 0   No Known Allergies   Exam:  BP 124/69 mmHg  Pulse 65  Temp(Src) 98 F (36.7 C) (Oral)  Resp 12  SpO2 100% Gen: Well NAD HEENT: EOMI,  MMM poor dentition. Multiple eroded a broken tooth. Left lower molar broken and tender to touch. Gumline erythema present. Lungs: Normal work of breathing. CTABL Heart: RRR no MRG Abd: NABS, Soft. Nondistended, Nontender Exts: Brisk capillary refill, warm and well perfused.  Back: Nontender to spinal midline. Tender palpation left lumbar paraspinal. Decreased lumbar range of motion due to pain. Lower extremity strength is intact. Reflexes are diminished but equal bilaterally. Sensation is intact throughout. Normal gait.  No results found for this or any previous visit (from the past 24 hour(s)). No results found.  Assessment and Plan: 43 y.o. male with  1) tooth pain. Stress the importance of following up with a dentist. We will no longer continue to prescribe opiates for this issue after today. Gave multiple dental options. Treatment with clindamycin and Norco. 2) lumbago due to myofascial disruption. Treatment with diclofenac.  Discussed warning signs or symptoms. Please see discharge instructions. Patient expresses understanding.     Rodolph BongEvan S Corey, MD 04/10/14 [id number]

## 2014-04-10 NOTE — Discharge Instructions (Signed)
Thank you for coming in today. Take clindamycin 3 times daily for 10 days Take norco for severe pain Take diclofenac instead of ibuprofen twice daily for moderate pain Come back or go to the emergency room if you notice new weakness new numbness problems walking or bowel or bladder problems.  Proofreader Services:  [facility] [phone] (ext [phone])  [id number]  Please call Dr. [place] office [phone] or cell [phone] [address] [place]  Cost for tooth removal $200 includes exam, Xray, and extraction and follow up visit.  Bring list of current medications with you.   [facility] - 336 [address] [phone]  [facility]  [address] [place], [place], [place], [address]  [phone], Ext. [phone]  2nd and 4th Thursday of the month at 6:30am (Simple extractions only - no wisdom teeth or surgery) First come/First serve -First 10 clients served  [facility] [place], [place] and [place] residents only)  [address] [place], [place], [address]  [phone]  [facility] Department  [phone]  [facility] Department  [phone]  [facility] Department - [facility]  [phone]  Please call [facility] at [phone] to get the details to get your tooth pulled.    Back Exercises These exercises may help you when beginning to rehabilitate your injury. Your symptoms may resolve with or without further involvement from your physician, physical therapist or athletic trainer. While completing these exercises, remember:   Restoring tissue flexibility helps normal motion to return to the joints. This allows healthier, less painful movement and activity.  An effective stretch should be held for at least 30 seconds.  A stretch should never be painful. You should only feel a gentle lengthening or release in the  stretched tissue. STRETCH - Extension, Prone on Elbows   Lie on your stomach on the floor, a bed will be too soft. Place your palms about shoulder width apart and at the height of your head.  Place your elbows under your shoulders. If this is too painful, stack pillows under your chest.  Allow your body to relax so that your hips drop lower and make contact more completely with the floor.  Hold this position for __________ seconds.  Slowly return to lying flat on the floor. Repeat __________ times. Complete this exercise __________ times per day.  RANGE OF MOTION - Extension, Prone Press Ups   Lie on your stomach on the floor, a bed will be too soft. Place your palms about shoulder width apart and at the height of your head.  Keeping your back as relaxed as possible, slowly straighten your elbows while keeping your hips on the floor. You may adjust the placement of your hands to maximize your comfort. As you gain motion, your hands will come more underneath your shoulders.  Hold this position __________ seconds.  Slowly return to lying flat on the floor. Repeat __________ times. Complete this exercise __________ times per day.  RANGE OF MOTION- Quadruped, Neutral Spine   Assume a hands and knees position on a firm surface. Keep your hands under your shoulders and your knees under your hips. You may place padding under your knees for comfort.  Drop your head and point your tail bone toward the ground below you. This will round out your low back like an angry cat. Hold this position for __________  seconds.  Slowly lift your head and release your tail bone so that your back sags into a large arch, like an old horse.  Hold this position for __________ seconds.  Repeat this until you feel limber in your low back.  Now, find your "sweet spot." This will be the most comfortable position somewhere between the two previous positions. This is your neutral spine. Once you have found this  position, tense your stomach muscles to support your low back.  Hold this position for __________ seconds. Repeat __________ times. Complete this exercise __________ times per day.  STRETCH - Flexion, Single Knee to Chest   Lie on a firm bed or floor with both legs extended in front of you.  Keeping one leg in contact with the floor, bring your opposite knee to your chest. Hold your leg in place by either grabbing behind your thigh or at your knee.  Pull until you feel a gentle stretch in your low back. Hold __________ seconds.  Slowly release your grasp and repeat the exercise with the opposite side. Repeat __________ times. Complete this exercise __________ times per day.  STRETCH - Hamstrings, Standing  Stand or sit and extend your right / left leg, placing your foot on a chair or foot stool  Keeping a slight arch in your low back and your hips straight forward.  Lead with your chest and lean forward at the waist until you feel a gentle stretch in the back of your right / left knee or thigh. (When done correctly, this exercise requires leaning only a small distance.)  Hold this position for __________ seconds. Repeat __________ times. Complete this stretch __________ times per day. STRENGTHENING - Deep Abdominals, Pelvic Tilt   Lie on a firm bed or floor. Keeping your legs in front of you, bend your knees so they are both pointed toward the ceiling and your feet are flat on the floor.  Tense your lower abdominal muscles to press your low back into the floor. This motion will rotate your pelvis so that your tail bone is scooping upwards rather than pointing at your feet or into the floor.  With a gentle tension and even breathing, hold this position for __________ seconds. Repeat __________ times. Complete this exercise __________ times per day.  STRENGTHENING - Abdominals, Crunches   Lie on a firm bed or floor. Keeping your legs in front of you, bend your knees so they are both  pointed toward the ceiling and your feet are flat on the floor. Cross your arms over your chest.  Slightly tip your chin down without bending your neck.  Tense your abdominals and slowly lift your trunk high enough to just clear your shoulder blades. Lifting higher can put excessive stress on the low back and does not further strengthen your abdominal muscles.  Control your return to the starting position. Repeat __________ times. Complete this exercise __________ times per day.  STRENGTHENING - Quadruped, Opposite UE/LE Lift   Assume a hands and knees position on a firm surface. Keep your hands under your shoulders and your knees under your hips. You may place padding under your knees for comfort.  Find your neutral spine and gently tense your abdominal muscles so that you can maintain this position. Your shoulders and hips should form a rectangle that is parallel with the floor and is not twisted.  Keeping your trunk steady, lift your right hand no higher than your shoulder and then your left leg no higher than your  hip. Make sure you are not holding your breath. Hold this position __________ seconds.  Continuing to keep your abdominal muscles tense and your back steady, slowly return to your starting position. Repeat with the opposite arm and leg. Repeat __________ times. Complete this exercise __________ times per day. Document Released: 05/06/2005 Document Revised: 07/11/2011 Document Reviewed: 07/31/2008 [facility] Patient Information 2015 [place]. This information is not intended to replace advice given to you by your health care provider. Make sure you discuss any questions you have with your health care provider.

## 2014-04-10 NOTE — ED Notes (Signed)
Pt  Has   2   Complaints   He  Reports  Back       Since  yest   Worse   On  Movement  denys  A  specfic  Injury        Pain  Is  Worse  On movement  -  He  Also  Reports  A  Toothache         X  3  Days           Has  Had  Similar    Episode  In past

## 2014-04-24 ENCOUNTER — Emergency Department (HOSPITAL_COMMUNITY)
Admission: EM | Admit: 2014-04-24 | Discharge: 2014-04-24 | Disposition: A | Discharge status: Home / Self Care | Payer: Self-pay | Source: Home / Self Care | Attending: Family Medicine | Admitting: Family Medicine

## 2014-04-24 ENCOUNTER — Encounter (HOSPITAL_COMMUNITY): Payer: Self-pay | Admitting: Emergency Medicine

## 2014-04-24 DIAGNOSIS — K0530 Chronic periodontitis, unspecified: Secondary | ICD-10-CM

## 2014-04-24 DIAGNOSIS — T85848A Pain due to other internal prosthetic devices, implants and grafts, initial encounter: Secondary | ICD-10-CM

## 2014-04-24 MED ORDER — KETOROLAC TROMETHAMINE 60 MG/2ML IM SOLN
INTRAMUSCULAR | Status: AC
  Filled 2014-04-24: qty 2

## 2014-04-24 MED ORDER — KETOROLAC TROMETHAMINE 60 MG/2ML IM SOLN
60.0000 mg | Freq: Once | INTRAMUSCULAR | Status: AC
  Administered 2014-04-24: 60 mg via INTRAMUSCULAR

## 2014-04-24 MED ORDER — CHLORHEXIDINE GLUCONATE 0.12 % MT SOLN: 15.0000 mL | Freq: Two times a day (BID) | OROMUCOSAL | Status: DC

## 2014-04-24 MED ORDER — CLINDAMYCIN HCL 300 MG PO CAPS: 300.0000 mg | ORAL_CAPSULE | Freq: Three times a day (TID) | ORAL | Status: DC

## 2014-04-24 NOTE — Discharge Instructions (Signed)
Your tooth needs treatment at the dentist Please start the antibiotics In 24 hours, please start taking ibuprofen 400-600mg   For pain and swelling Please use the mouthwash to help prevent infection in the future Please go to the emergency room or call your doctor if your pain is not improved.

## 2014-04-24 NOTE — ED Notes (Signed)
Toothache, history of the same

## 2014-04-24 NOTE — ED Provider Notes (Signed)
CSN: [id number]     Arrival date & time 04/24/14  1236 History   First MD Initiated Contact with Patient 04/24/14 1301     Chief Complaint  Patient presents with  . Dental Pain   (Consider location/radiation/quality/duration/timing/severity/associated sxs/prior Treatment) HPI Treated for dental infection on 04/10/14. Symptoms initially resolved but returned on Monday the 21st. Using oragel w/o much benefit (starting to get bumps on skin from the gel). Using BC powder in the tooth cavity. Getting worse. Same teeth as before. Maxillary and mandibular molars on R. Gums swelling. Denies discharge or bleeding. Foul odor and taste. Getting worse.   Denies Fevers, CP, SOB, rash. Malaise.   [place]   History reviewed. No pertinent past medical history. History reviewed. No pertinent past surgical history. No family history on file. History  Substance Use Topics  . Smoking status: Never Smoker   . Smokeless tobacco: Not on file  . Alcohol Use: No    Review of Systems Per HPI with all other pertinent systems negative.   Allergies  Review of patient's allergies indicates no known allergies.  Home Medications   Prior to Admission medications   Medication Sig Start Date End Date Taking? Authorizing Provider  Aspirin-Salicylamide-Caffeine (BC HEADACHE POWDER PO) Take 0.5 packets by mouth every 6 (six) hours as needed (for pain).    Historical Provider, MD  chlorhexidine (PERIDEX) 0.12 % solution Use as directed 15 mLs in the mouth or throat 2 (two) times daily. 04/24/14   Ozella Rocksavid J Elianne Gubser, MD  clindamycin (CLEOCIN) 300 MG capsule Take 1 capsule (300 mg total) by mouth 3 (three) times daily. 04/24/14   Ozella Rocksavid J Teodora Baumgarten, MD  diclofenac (VOLTAREN) 75 MG EC tablet Take 1 tablet (75 mg total) by mouth 2 (two) times daily as needed. 04/10/14   Rodolph BongEvan S Corey, MD  HYDROcodone-acetaminophen (NORCO/VICODIN) 5-325 MG per tablet Take 1 tablet by  mouth every 6 (six) hours as needed. 04/10/14   Rodolph BongEvan S Corey, MD  triamcinolone cream (KENALOG) 0.1 % Apply 1 application topically 3 (three) times daily. 03/05/14   Reuben Likesavid C Keller, MD   BP 112/61 mmHg  Pulse 75  Temp(Src) 97.7 F (36.5 C) (Oral)  Resp 16  SpO2 98% Physical Exam  Constitutional: He is oriented to person, place, and time. He appears well-developed and well-nourished. No distress.  HENT:  Head: Normocephalic and atraumatic.  Numerous dental carries w/ mild periodontal ttp around the first molars on the R mandible and maxilla.    Eyes: EOM are normal. Pupils are equal, round, and reactive to light.  Neck: Normal range of motion.  Cardiovascular: Normal rate, normal heart sounds and intact distal pulses.   Pulmonary/Chest: Effort normal and breath sounds normal.  Abdominal: Soft. He exhibits no distension.  Musculoskeletal: Normal range of motion.  Neurological: He is alert and oriented to person, place, and time.  Skin: Skin is warm and dry. He is not diaphoretic.  Psychiatric: He has a normal mood and affect. His behavior is normal. Judgment and thought content normal.    ED Course  Procedures (including critical care time) Labs Review Labs Reviewed - No data to display  Imaging Review No results found.   MDM   1. Dental implant pain, initial encounter   2. Periodontitis    Pt w/ recurring dental pain Restart Clinda Toradol 60mg  IM in office Chlorhexidine mouthwash No opioids given.  OF NOTE CALLED PHARMACY WHO REPORTED THAT THE PT  DID NOT PICK UP HIS CLINDAMYCIN FROM THE 10TH BUT DID PICK UP THE NORCO. AS NOTED IN HPI, PT CLEARLY STATED THAT HE PICKED UP THE CLINDAMYCIN AND IT HELPED. Precautions given and all questions answered  Shelly Flattenavid Jerry Haugen, MD Family Medicine 04/24/2014, 1:28 PM      Ozella Rocksavid J Vedha Tercero, MD 04/24/14 1329

## 2014-07-07 ENCOUNTER — Emergency Department (INDEPENDENT_AMBULATORY_CARE_PROVIDER_SITE_OTHER)
Admission: EM | Admit: 2014-07-07 | Discharge: 2014-07-07 | Disposition: A | Discharge status: Home / Self Care | Payer: Self-pay | Source: Home / Self Care | Attending: Family Medicine | Admitting: Family Medicine

## 2014-07-07 ENCOUNTER — Encounter (HOSPITAL_COMMUNITY): Payer: Self-pay | Admitting: *Deleted

## 2014-07-07 DIAGNOSIS — S61219A Laceration without foreign body of unspecified finger without damage to nail, initial encounter: Secondary | ICD-10-CM

## 2014-07-07 DIAGNOSIS — K047 Periapical abscess without sinus: Secondary | ICD-10-CM

## 2014-07-07 MED ORDER — CLINDAMYCIN HCL 150 MG PO CAPS: 150.0000 mg | ORAL_CAPSULE | Freq: Four times a day (QID) | ORAL | Status: DC

## 2014-07-07 MED ORDER — DICLOFENAC POTASSIUM 50 MG PO TABS: 50.0000 mg | ORAL_TABLET | Freq: Three times a day (TID) | ORAL | Status: DC

## 2014-07-07 NOTE — Discharge Instructions (Signed)
Take medicine as prescribed, see your dentist as soon as possible °

## 2014-07-07 NOTE — ED Notes (Addendum)
C/o abscess L upper jaw 1 1/2 weeks ago. Taking Cleocin that he left over from previous infection.  Tooth broke off this morning. Has tooth with him.  Cut L index finger today while cutting bushes.  Small avulsion laceration to tip of finger and part of nail.  Bleeding stopped.

## 2014-07-07 NOTE — ED Provider Notes (Signed)
CSN: [id number]     Arrival date & time 07/07/14  1916 History   First MD Initiated Contact with Patient 07/07/14 2030     Chief Complaint  Patient presents with  . Extremity Laceration  . Oral Swelling   (Consider location/radiation/quality/duration/timing/severity/associated sxs/prior Treatment) Patient is a 44 y.o. male presenting with tooth pain. The history is provided by the patient.  Dental Pain Location:  Upper Upper teeth location:  12/LU 1st bicuspid Quality:  Throbbing, aching and constant Severity:  Moderate Onset quality:  Sudden Progression:  Worsening Chronicity:  New Context: abscess, dental fracture and poor dentition   Context comment:  Tooth pain for 2wks and today tooth broke from decay. Relieved by:  None tried Worsened by:  Nothing tried Associated symptoms: no facial swelling and no fever     History reviewed. No pertinent past medical history. History reviewed. No pertinent past surgical history. Family History  Problem Relation Age of Onset  . Diabetes Mother   . Heart Problems Mother   . Heart Problems Father    History  Substance Use Topics  . Smoking status: Never Smoker   . Smokeless tobacco: Not on file  . Alcohol Use: No    Review of Systems  Constitutional: Negative.  Negative for fever.  HENT: Positive for dental problem. Negative for facial swelling.   Skin: Positive for wound.    Allergies  Review of patient's allergies indicates no known allergies.  Home Medications   Prior to Admission medications   Medication Sig Start Date End Date Taking? Authorizing Provider  Aspirin-Salicylamide-Caffeine (BC HEADACHE POWDER PO) Take 0.5 packets by mouth every 6 (six) hours as needed (for pain).   Yes Historical Provider, MD  chlorhexidine (PERIDEX) 0.12 % solution Use as directed 15 mLs in the mouth or throat 2 (two) times daily. 04/24/14  Yes Ozella Rocksavid J Merrell, MD  clindamycin (CLEOCIN) 150 MG capsule Take 1 capsule (150 mg total) by mouth  4 (four) times daily. 07/07/14   Linna HoffJames D Gayle Martinez, MD  diclofenac (CATAFLAM) 50 MG tablet Take 1 tablet (50 mg total) by mouth 3 (three) times daily. For dental pain 07/07/14   Linna HoffJames D Neythan Kozlov, MD  diclofenac (VOLTAREN) 75 MG EC tablet Take 1 tablet (75 mg total) by mouth 2 (two) times daily as needed. 04/10/14   Rodolph BongEvan S Corey, MD  HYDROcodone-acetaminophen (NORCO/VICODIN) 5-325 MG per tablet Take 1 tablet by mouth every 6 (six) hours as needed. 04/10/14   Rodolph BongEvan S Corey, MD  triamcinolone cream (KENALOG) 0.1 % Apply 1 application topically 3 (three) times daily. 03/05/14   Reuben Likesavid C Keller, MD   BP 116/80 mmHg  Pulse 65  Temp(Src) 98.4 F (36.9 C) (Oral)  Resp 18  SpO2 98% Physical Exam  Constitutional: He is oriented to person, place, and time. He appears well-developed and well-nourished.  HENT:  Mouth/Throat: Abnormal dentition. Dental abscesses and dental caries present.    Neurological: He is alert and oriented to person, place, and time.  Skin: Skin is warm and dry.  Superficial skin avulsion from tip of lif, no active bleeding, tetanus 2 yr ago.    ED Course  Procedures (including critical care time) Labs Review Labs Reviewed - No data to display  Imaging Review No results found.   MDM   1. Dental abscess   2. Finger laceration, initial encounter       Linna HoffJames D Geovanny Sartin, MD 07/07/14 2133

## 2014-11-19 ENCOUNTER — Encounter (HOSPITAL_COMMUNITY): Payer: Self-pay | Admitting: *Deleted

## 2014-11-19 ENCOUNTER — Emergency Department (INDEPENDENT_AMBULATORY_CARE_PROVIDER_SITE_OTHER): Payer: Self-pay

## 2014-11-19 ENCOUNTER — Emergency Department (INDEPENDENT_AMBULATORY_CARE_PROVIDER_SITE_OTHER)
Admission: EM | Admit: 2014-11-19 | Discharge: 2014-11-19 | Disposition: A | Discharge status: Home / Self Care | Payer: Self-pay | Source: Home / Self Care

## 2014-11-19 DIAGNOSIS — S0093XA Contusion of unspecified part of head, initial encounter: Secondary | ICD-10-CM

## 2014-11-19 DIAGNOSIS — S139XXA Sprain of joints and ligaments of unspecified parts of neck, initial encounter: Secondary | ICD-10-CM

## 2014-11-19 DIAGNOSIS — S39012A Strain of muscle, fascia and tendon of lower back, initial encounter: Secondary | ICD-10-CM

## 2014-11-19 MED ORDER — MELOXICAM 15 MG PO TABS
15.0000 mg | ORAL_TABLET | Freq: Every day | ORAL | Status: DC
Start: 2014-11-19 — End: 2015-05-11

## 2014-11-19 MED ORDER — CYCLOBENZAPRINE HCL 5 MG PO TABS: 5.0000 mg | ORAL_TABLET | Freq: Three times a day (TID) | ORAL | Status: DC | PRN
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
## 2014-11-19 NOTE — Discharge Instructions (Signed)
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
## 2015-05-11 ENCOUNTER — Encounter (HOSPITAL_COMMUNITY): Payer: Self-pay | Admitting: Emergency Medicine

## 2015-05-11 ENCOUNTER — Emergency Department (INDEPENDENT_AMBULATORY_CARE_PROVIDER_SITE_OTHER)
Admission: EM | Admit: 2015-05-11 | Discharge: 2015-05-11 | Disposition: A | Discharge status: Home / Self Care | Payer: Self-pay | Source: Home / Self Care | Attending: Family Medicine | Admitting: Family Medicine

## 2015-05-11 DIAGNOSIS — J069 Acute upper respiratory infection, unspecified: Secondary | ICD-10-CM

## 2015-05-11 DIAGNOSIS — K089 Disorder of teeth and supporting structures, unspecified: Secondary | ICD-10-CM

## 2015-05-11 DIAGNOSIS — J3489 Other specified disorders of nose and nasal sinuses: Secondary | ICD-10-CM

## 2015-05-11 DIAGNOSIS — K0889 Other specified disorders of teeth and supporting structures: Secondary | ICD-10-CM

## 2015-05-11 MED ORDER — DICLOFENAC POTASSIUM 50 MG PO TABS: 50.0000 mg | ORAL_TABLET | Freq: Three times a day (TID) | ORAL | Status: DC

## 2015-05-11 MED ORDER — AMOXICILLIN 500 MG PO CAPS: 500.0000 mg | ORAL_CAPSULE | Freq: Three times a day (TID) | ORAL | Status: DC

## 2015-05-11 MED ORDER — TRAMADOL HCL 50 MG PO TABS: 50.0000 mg | ORAL_TABLET | Freq: Four times a day (QID) | ORAL | Status: DC | PRN

## 2015-05-11 NOTE — ED Notes (Signed)
Toothache, bottom, left for 2 days.  .   Complains of cough and headache for 3 days Sinus drainage

## 2015-05-11 NOTE — Discharge Instructions (Signed)
Dental Care and Dentist Visits Dental care supports good overall health. Regular dental visits can also help you avoid dental pain, bleeding, infection, and other more serious health problems in the future. It is important to keep the mouth healthy because diseases in the teeth, gums, and other oral tissues can spread to other areas of the body. Some problems, such as diabetes, heart disease, and pre-term labor have been associated with poor oral health.  See your dentist every 6 months. If you experience emergency problems such as a toothache or broken tooth, go to the dentist right away. If you see your dentist regularly, you may catch problems early. It is easier to be treated for problems in the early stages.  WHAT TO EXPECT AT A DENTIST VISIT  Your dentist will look for many common oral health problems and recommend proper treatment. At your regular dental visit, you can expect:  Gentle cleaning of the teeth and gums. This includes scraping and polishing. This helps to remove the sticky substance around the teeth and gums (plaque). Plaque forms in the mouth shortly after eating. Over time, plaque hardens on the teeth as tartar. If tartar is not removed regularly, it can cause problems. Cleaning also helps remove stains.  Periodic X-rays. These pictures of the teeth and supporting bone will help your dentist assess the health of your teeth.  Periodic fluoride treatments. Fluoride is a natural mineral shown to help strengthen teeth. Fluoride treatmentinvolves applying a fluoride gel or varnish to the teeth. It is most commonly done in children.  Examination of the mouth, tongue, jaws, teeth, and gums to look for any oral health problems, such as:  Cavities (dental caries). This is decay on the tooth caused by plaque, sugar, and acid in the mouth. It is best to catch a cavity when it is small.  Inflammation of the gums caused by plaque buildup (gingivitis).  Problems with the mouth or malformed  or misaligned teeth.  Oral cancer or other diseases of the soft tissues or jaws. KEEP YOUR TEETH AND GUMS HEALTHY For healthy teeth and gums, follow these general guidelines as well as your dentist's specific advice:  Have your teeth professionally cleaned at the dentist every 6 months.  Brush twice daily with a fluoride toothpaste.  Floss your teeth daily.  Ask your dentist if you need fluoride supplements, treatments, or fluoride toothpaste.  Eat a healthy diet. Reduce foods and drinks with added sugar.  Avoid smoking. TREATMENT FOR ORAL HEALTH PROBLEMS If you have oral health problems, treatment varies depending on the conditions present in your teeth and gums.  Your caregiver will most likely recommend good oral hygiene at each visit.  For cavities, gingivitis, or other oral health disease, your caregiver will perform a procedure to treat the problem. This is typically done at a separate appointment. Sometimes your caregiver will refer you to another dental specialist for specific tooth problems or for surgery. SEEK IMMEDIATE DENTAL CARE IF:  You have pain, bleeding, or soreness in the gum, tooth, jaw, or mouth area.  A permanent tooth becomes loose or separated from the gum socket.  You experience a blow or injury to the mouth or jaw area.   This information is not intended to replace advice given to you by your health care provider. Make sure you discuss any questions you have with your health care provider.   Document Released: 12/29/2010 Document Revised: 07/11/2011 Document Reviewed: 12/29/2010 Elsevier Interactive Patient Education 2016 [employer]  Upper Respiratory Infection,  Adult For drainage he may take Claritin, Zyrtec or Allegra For cough Robitussin-DM For congestion may take Sudafed PE 10 mg every 4 hours as needed Saline nasal spray use frequently. Most upper respiratory infections (URIs) are a viral infection of the air passages leading to the lungs.  A URI affects the nose, throat, and upper air passages. The most common type of URI is nasopharyngitis and is typically referred to as "the common cold." URIs run their course and usually go away on their own. Most of the time, a URI does not require medical attention, but sometimes a bacterial infection in the upper airways can follow a viral infection. This is called a secondary infection. Sinus and middle ear infections are common types of secondary upper respiratory infections. Bacterial pneumonia can also complicate a URI. A URI can worsen asthma and chronic obstructive pulmonary disease (COPD). Sometimes, these complications can require emergency medical care and may be life threatening.  CAUSES Almost all URIs are caused by viruses. A virus is a type of germ and can spread from one person to another.  RISKS FACTORS You may be at risk for a URI if:   You smoke.   You have chronic heart or lung disease.  You have a weakened defense (immune) system.   You are very young or very old.   You have nasal allergies or asthma.  You work in crowded or poorly ventilated areas.  You work in health care facilities or schools. SIGNS AND SYMPTOMS  Symptoms typically develop 2-3 days after you come in contact with a cold virus. Most viral URIs last 7-10 days. However, viral URIs from the influenza virus (flu virus) can last 14-18 days and are typically more severe. Symptoms may include:   Runny or stuffy (congested) nose.   Sneezing.   Cough.   Sore throat.   Headache.   Fatigue.   Fever.   Loss of appetite.   Pain in your forehead, behind your eyes, and over your cheekbones (sinus pain).  Muscle aches.  DIAGNOSIS  Your health care provider may diagnose a URI by:  Physical exam.  Tests to check that your symptoms are not due to another condition such as:  Strep throat.  Sinusitis.  Pneumonia.  Asthma. TREATMENT  A URI goes away on its own with time. It  cannot be cured with medicines, but medicines may be prescribed or recommended to relieve symptoms. Medicines may help:  Reduce your fever.  Reduce your cough.  Relieve nasal congestion. HOME CARE INSTRUCTIONS   Take medicines only as directed by your health care provider.   Gargle warm saltwater or take cough drops to comfort your throat as directed by your health care provider.  Use a warm mist humidifier or inhale steam from a shower to increase air moisture. This may make it easier to breathe.  Drink enough fluid to keep your urine clear or pale yellow.   Eat soups and other clear broths and maintain good nutrition.   Rest as needed.   Return to work when your temperature has returned to normal or as your health care provider advises. You may need to stay home longer to avoid infecting others. You can also use a face mask and careful hand washing to prevent spread of the virus.  Increase the usage of your inhaler if you have asthma.   Do not use any tobacco products, including cigarettes, chewing tobacco, or electronic cigarettes. If you need help quitting, ask your health care provider.  PREVENTION  The best way to protect yourself from getting a cold is to practice good hygiene.   Avoid oral or hand contact with people with cold symptoms.   Wash your hands often if contact occurs.  There is no clear evidence that vitamin C, vitamin E, echinacea, or exercise reduces the chance of developing a cold. However, it is always recommended to get plenty of rest, exercise, and practice good nutrition.  SEEK MEDICAL CARE IF:   You are getting worse rather than better.   Your symptoms are not controlled by medicine.   You have chills.  You have worsening shortness of breath.  You have brown or red mucus.  You have yellow or brown nasal discharge.  You have pain in your face, especially when you bend forward.  You have a fever.  You have swollen neck glands.  You  have pain while swallowing.  You have white areas in the back of your throat. SEEK IMMEDIATE MEDICAL CARE IF:   You have severe or persistent:  Headache.  Ear pain.  Sinus pain.  Chest pain.  You have chronic lung disease and any of the following:  Wheezing.  Prolonged cough.  Coughing up blood.  A change in your usual mucus.  You have a stiff neck.  You have changes in your:  Vision.  Hearing.  Thinking.  Mood. MAKE SURE YOU:   Understand these instructions.  Will watch your condition.  Will get help right away if you are not doing well or get worse.   This information is not intended to replace advice given to you by your health care provider. Make sure you discuss any questions you have with your health care provider.   Document Released: 10/12/2000 Document Revised: 09/02/2014 Document Reviewed: 07/24/2013 Elsevier Interactive Patient Education [employer]2016 [employer]

## 2015-05-11 NOTE — ED Provider Notes (Signed)
CSN: [id number]     Arrival date & time 05/11/15  1635 History   First MD Initiated Contact with Patient 05/11/15 1705     Chief Complaint  Patient presents with  . Dental Pain  . Cough   (Consider location/radiation/quality/duration/timing/severity/associated sxs/prior Treatment) HPI Comments: 45 year old male complaining of a left lower toothache for 2 days. He is also complaining of a cough, headache and PND for 3 days. He has taken cough syrup and Tylenol. Denies fever or shortness of breath. He has a history of several visits for toothache. He has been seen here for the same problematic tooth as well as for others. He has been advised several times in the past 2-3 years that he must follow-up with a dentist. He says he cannot afford to see one.     History reviewed. No pertinent past medical history. History reviewed. No pertinent past surgical history. Family History  Problem Relation Age of Onset  . Diabetes Mother   . Heart Problems Mother   . Heart Problems Father    Social History  Substance Use Topics  . Smoking status: Never Smoker   . Smokeless tobacco: None  . Alcohol Use: No    Review of Systems  Constitutional: Negative for fever, activity change and fatigue.  HENT: Positive for congestion, dental problem, postnasal drip and rhinorrhea.   Respiratory: Positive for cough. Negative for shortness of breath.   Cardiovascular: Negative for chest pain.  Neurological: Positive for headaches.    Allergies  Review of patient's allergies indicates no known allergies.  Home Medications   Prior to Admission medications   Medication Sig Start Date End Date Taking? Authorizing Provider  aspirin 81 MG tablet Take 81 mg by mouth daily.   Yes Historical Provider, MD  amoxicillin (AMOXIL) 500 MG capsule Take 1 capsule (500 mg total) by mouth 3 (three) times daily. 05/11/15   Hayden Rasmussen, NP  Aspirin-Salicylamide-Caffeine (BC HEADACHE POWDER PO) Take 0.5 packets by mouth every  6 (six) hours as needed (for pain).    Historical Provider, MD  diclofenac (CATAFLAM) 50 MG tablet Take 1 tablet (50 mg total) by mouth 3 (three) times daily. One tablet TID with food prn pain. 05/11/15   Hayden Rasmussen, NP  traMADol (ULTRAM) 50 MG tablet Take 1 tablet (50 mg total) by mouth every 6 (six) hours as needed. 05/11/15   Hayden Rasmussen, NP   Meds Ordered and Administered this Visit  Medications - No data to display  BP 124/73 mmHg  Pulse 76  Temp(Src) 97.3 F (36.3 C) (Oral)  Resp 16  SpO2 97% No data found.   Physical Exam  Constitutional: He appears well-developed and well-nourished. No distress.  HENT:  Bilateral TMs are normal Oropharynx with PND and cobblestoning. No exudates The left third molar has eroded flat to the gumline. Minimal surrounding erythema which appears chronic. No swelling observed. Positive for dental tenderness in the lower left 3 molars.  Eyes: Conjunctivae and EOM are normal.  Neck: Normal range of motion. Neck supple.  Cardiovascular: Normal rate, regular rhythm and normal heart sounds.   Pulmonary/Chest: Effort normal and breath sounds normal. No respiratory distress.  Lymphadenopathy:    He has no cervical adenopathy.  Neurological: He is alert. He exhibits normal muscle tone.  Skin: Skin is warm and dry.  Psychiatric: He has a normal mood and affect.  Nursing note and vitals reviewed.   ED Course  Procedures (including critical care time)  Labs Review Labs Reviewed - No  data to display  Imaging Review No results found.   Visual Acuity Review  Right Eye Distance:   Left Eye Distance:   Bilateral Distance:    Right Eye Near:   Left Eye Near:    Bilateral Near:         MDM   1. URI (upper respiratory infection)   2. Sinus drainage   3. Toothache   4. Poor dentition    For drainage he may take Claritin, Zyrtec or Allegra For cough Robitussin-DM For congestion may take Sudafed PE 10 mg every 4 hours as needed Saline nasal  spray use frequently. Amoxil as dir Tramadol 50 mg #15 Must see dentist ASAP    Hayden Rasmussenavid Hezikiah Retzloff, NP 05/11/15 1827

## 2015-07-26 ENCOUNTER — Emergency Department (HOSPITAL_COMMUNITY)
Admission: EM | Admit: 2015-07-26 | Discharge: 2015-07-27 | Disposition: A | Discharge status: Home / Self Care | Payer: Self-pay | Attending: Emergency Medicine | Admitting: Emergency Medicine

## 2015-07-26 ENCOUNTER — Emergency Department (HOSPITAL_COMMUNITY): Payer: Self-pay

## 2015-07-26 ENCOUNTER — Encounter (HOSPITAL_COMMUNITY): Payer: Self-pay | Admitting: Emergency Medicine

## 2015-07-26 DIAGNOSIS — Z791 Long term (current) use of non-steroidal anti-inflammatories (NSAID): Secondary | ICD-10-CM | POA: Insufficient documentation

## 2015-07-26 DIAGNOSIS — Y998 Other external cause status: Secondary | ICD-10-CM | POA: Insufficient documentation

## 2015-07-26 DIAGNOSIS — X58XXXA Exposure to other specified factors, initial encounter: Secondary | ICD-10-CM | POA: Insufficient documentation

## 2015-07-26 DIAGNOSIS — S59902A Unspecified injury of left elbow, initial encounter: Secondary | ICD-10-CM | POA: Insufficient documentation

## 2015-07-26 DIAGNOSIS — Y9371 Activity, boxing: Secondary | ICD-10-CM | POA: Insufficient documentation

## 2015-07-26 DIAGNOSIS — Z7982 Long term (current) use of aspirin: Secondary | ICD-10-CM | POA: Insufficient documentation

## 2015-07-26 DIAGNOSIS — Z792 Long term (current) use of antibiotics: Secondary | ICD-10-CM | POA: Insufficient documentation

## 2015-07-26 DIAGNOSIS — Y9289 Other specified places as the place of occurrence of the external cause: Secondary | ICD-10-CM | POA: Insufficient documentation

## 2015-07-26 NOTE — ED Notes (Signed)
Pt states that he has had pain x 1 month in his L elbow. Thinks he did it while he was 'shadow boxing' Alert and oriented.

## 2015-07-26 NOTE — ED Notes (Signed)
Pt reports likes to "shadow box" and hurt his Left elbow about a month ago.  Pt sts "tried to put it back in place about a week ago".  Pt can move fingers and states his muscle leading up to elbow from hand is burning.

## 2015-07-26 NOTE — ED Provider Notes (Signed)
CSN: [id number]     Arrival date & time 07/26/15  2228 History  By signing my name below, I, Octavia Heir, attest that this documentation has been prepared under the direction and in the presence of Marlon Pel, PA-C. Electronically Signed: Octavia Heir, ED Scribe. 07/27/2015. 12:01 AM.    Chief Complaint  Patient presents with  . Elbow Pain     The history is provided by the patient. No language interpreter was used.   HPI Comments: Edward Barr is a 45 y.o. male who presents to the Emergency Department complaining of constant, gradual worsening left elbow pain with associated swelling onset about one month ago. Per pt, he states he believed he injured his left elbow while "shadow boxing". He has been trying to keep his elbow moving but it has not given him any relief. Pt says he has been taking some Voltaren to alleviate his pain with no relief.    History reviewed. No pertinent past medical history. History reviewed. No pertinent past surgical history. Family History  Problem Relation Age of Onset  . Diabetes Mother   . Heart Problems Mother   . Heart Problems Father    Social History  Substance Use Topics  . Smoking status: Never Smoker   . Smokeless tobacco: None  . Alcohol Use: No    Review of Systems  Musculoskeletal: Positive for joint swelling and arthralgias.  All other systems reviewed and are negative.     Allergies  Review of patient's allergies indicates no known allergies.  Home Medications   Prior to Admission medications   Medication Sig Start Date End Date Taking? Authorizing Provider  amoxicillin (AMOXIL) 500 MG capsule Take 1 capsule (500 mg total) by mouth 3 (three) times daily. 05/11/15   Hayden Rasmussen, NP  aspirin 81 MG tablet Take 81 mg by mouth daily.    Historical Provider, MD  Aspirin-Salicylamide-Caffeine (BC HEADACHE POWDER PO) Take 0.5 packets by mouth every 6 (six) hours as needed (for pain).    Historical Provider, MD  diclofenac  (CATAFLAM) 50 MG tablet Take 1 tablet (50 mg total) by mouth 3 (three) times daily. One tablet TID with food prn pain. 05/11/15   Hayden Rasmussen, NP  traMADol (ULTRAM) 50 MG tablet Take 1 tablet (50 mg total) by mouth every 6 (six) hours as needed. 05/11/15   Hayden Rasmussen, NP  traMADol (ULTRAM) 50 MG tablet Take 1 tablet (50 mg total) by mouth every 6 (six) hours as needed. 07/27/15   Marlon Pel, PA-C   Triage vitals: BP 121/81 mmHg  Pulse 82  Temp(Src) 97.8 F (36.6 C) (Oral)  Resp 18  SpO2 100% Physical Exam  Constitutional: He appears well-developed and well-nourished. No distress.  HENT:  Head: Normocephalic and atraumatic.  Eyes: Pupils are equal, round, and reactive to light. Right eye exhibits no discharge. Left eye exhibits no discharge.  Neck: Normal range of motion. Neck supple.  Cardiovascular: Normal rate and regular rhythm.   Pulmonary/Chest: Effort normal. No respiratory distress.  Abdominal: Soft.  Musculoskeletal:       Left elbow: He exhibits decreased range of motion (due to pain). He exhibits no swelling, no effusion, no deformity and no laceration. Tenderness found. Lateral epicondyle tenderness noted. No radial head, no medial epicondyle and no olecranon process tenderness noted.  Neurological: He is alert. Coordination normal.  Skin: Skin is warm and dry. No rash noted. He is not diaphoretic.  Psychiatric: He has a normal mood and affect. His behavior is normal.  Nursing note and vitals reviewed.   ED Course  Procedures  DIAGNOSTIC STUDIES: Oxygen Saturation is 100% on RA, normal by my interpretation.  COORDINATION OF CARE:  12:01 AM Discussed treatment plan with pt at bedside and pt agreed to plan.  Labs Review Labs Reviewed - No data to display  Imaging Review No results found. I have personally reviewed and evaluated these images and lab results as part of my medical decision-making.   EKG Interpretation None      MDM   Final diagnoses:  Elbow  injury, left, initial encounter   Patient X-Ray negative for obvious fracture or dislocation.  Pt advised to follow up with orthopedics. Patient given shoulder sling while in ED, conservative therapy recommended and discussed. Patient will be discharged home & is agreeable with above plan. Returns precautions discussed. Pt appears safe for discharge. Rx; medication for pain  I personally performed the services described in this documentation, which was scribed in my presence. The recorded information has been reviewed and is accurate.   Marlon Peliffany Sage Hammill, PA-C 08/07/15 2016  Gilda Creasehristopher J Pollina, MD 08/09/15 [phone]

## 2015-07-27 MED ORDER — TRAMADOL HCL 50 MG PO TABS: 50.0000 mg | ORAL_TABLET | Freq: Four times a day (QID) | ORAL | Status: DC | PRN

## 2015-07-27 MED ORDER — CYCLOBENZAPRINE HCL 10 MG PO TABS
5.0000 mg | ORAL_TABLET | Freq: Once | ORAL | Status: AC
  Administered 2015-07-27: 5 mg via ORAL
  Filled 2015-07-27: qty 1

## 2015-07-27 NOTE — Discharge Instructions (Signed)
How to Use a Sling °A sling is a type of hanging bandage that is worn around your neck to protect an injured arm, shoulder, or other body part. You may need to wear a sling to keep you from moving (immobilize) the injured body part while it heals. Keeping the injured part of your body still reduces pain and speeds up healing. Your health care provider may recommend using a sling if you have:  °· A broken arm. °· A broken collarbone. °· A shoulder injury. °· Surgery. °RISKS AND COMPLICATIONS °Wearing a sling the wrong way can: °· Make your injury worse. °· Cause stiffness or numbness. °· Affect blood circulation in your arm and hand. This can cause tingling or numbness in your fingers or hands. °HOW TO USE A SLING °The way that you should use a sling depends on your injury. It is important that you follow all of your health care provider's instructions for your injury. Also follow these general guidelines: °· Wear the sling so that your arm bends 90 degrees at the elbow. That is like a right angle or the shape of a capital letter "L." The sling should also support your wrist and your hand. °· Try to avoid moving your arm. °· Do not lie down flat on your back while wearing a sling. Sleep in a recliner or use pillows to raise your upper body in bed. °· Do not twist, raise, or move your arm in a way that could make your injury worse. °· Do not lean on your arm while wearing a sling. °· Do not lift anything while wearing a sling. °SEEK MEDICAL CARE IF: °· You have bruising, swelling, or pain that is getting worse. °· Your pain medicine is not helping. °· You have a fever. °SEEK IMMEDIATE MEDICAL CARE IF: °· Your fingers are numb or tingling. °· Your fingers turn blue or feel cold to the touch. °· You cannot control the bleeding from your injury. °· You are short of breath. °  °This information is not intended to replace advice given to you by your health care provider. Make sure you discuss any questions you have with  your health care provider. °  °Document Released: 12/01/2003 Document Revised: 05/09/2014 Document Reviewed: 02/19/2014 °Elsevier Interactive Patient Education ©2016 [employer] ° °

## 2018-05-02 DIAGNOSIS — I82409 Acute embolism and thrombosis of unspecified deep veins of unspecified lower extremity: Secondary | ICD-10-CM

## 2018-05-02 HISTORY — DX: Acute embolism and thrombosis of unspecified deep veins of unspecified lower extremity: I82.409

## 2018-07-18 ENCOUNTER — Other Ambulatory Visit: Payer: Self-pay

## 2018-07-18 ENCOUNTER — Emergency Department (HOSPITAL_COMMUNITY)
Admission: EM | Admit: 2018-07-18 | Discharge: 2018-07-19 | Disposition: A | Discharge status: Home / Self Care | Payer: Self-pay | Attending: Emergency Medicine | Admitting: Emergency Medicine

## 2018-07-18 ENCOUNTER — Encounter (HOSPITAL_COMMUNITY): Payer: Self-pay

## 2018-07-18 DIAGNOSIS — Z7982 Long term (current) use of aspirin: Secondary | ICD-10-CM | POA: Insufficient documentation

## 2018-07-18 DIAGNOSIS — M79662 Pain in left lower leg: Secondary | ICD-10-CM | POA: Insufficient documentation

## 2018-07-18 DIAGNOSIS — R5383 Other fatigue: Secondary | ICD-10-CM | POA: Insufficient documentation

## 2018-07-18 NOTE — ED Triage Notes (Signed)
Pt complains of being fatigued and unable to stay awake for two days, he also states that he has left lower leg pain Pt denies any travel

## 2018-07-19 ENCOUNTER — Encounter (HOSPITAL_COMMUNITY): Payer: Self-pay | Admitting: *Deleted

## 2018-07-19 ENCOUNTER — Ambulatory Visit (HOSPITAL_COMMUNITY)
Admission: RE | Admit: 2018-07-19 | Discharge: 2018-07-19 | Disposition: A | Discharge status: Home / Self Care | Payer: Self-pay | Source: Ambulatory Visit | Attending: Emergency Medicine | Admitting: Emergency Medicine

## 2018-07-19 ENCOUNTER — Emergency Department (HOSPITAL_COMMUNITY)
Admission: EM | Admit: 2018-07-19 | Discharge: 2018-07-19 | Disposition: A | Discharge status: Home / Self Care | Payer: Self-pay | Attending: Emergency Medicine | Admitting: Emergency Medicine

## 2018-07-19 ENCOUNTER — Other Ambulatory Visit: Payer: Self-pay

## 2018-07-19 DIAGNOSIS — I8290 Acute embolism and thrombosis of unspecified vein: Secondary | ICD-10-CM | POA: Insufficient documentation

## 2018-07-19 DIAGNOSIS — I824Y2 Acute embolism and thrombosis of unspecified deep veins of left proximal lower extremity: Secondary | ICD-10-CM | POA: Insufficient documentation

## 2018-07-19 DIAGNOSIS — M79662 Pain in left lower leg: Secondary | ICD-10-CM | POA: Insufficient documentation

## 2018-07-19 DIAGNOSIS — M79609 Pain in unspecified limb: Secondary | ICD-10-CM

## 2018-07-19 LAB — CBC WITH DIFFERENTIAL/PLATELET
Abs Immature Granulocytes: 0.02 10*3/uL (ref 0.00–0.07)
Basophils Absolute: 0 10*3/uL (ref 0.0–0.1)
Basophils Relative: 0 %
Eosinophils Absolute: 0.5 10*3/uL (ref 0.0–0.5)
Eosinophils Relative: 7 %
HCT: 39 % (ref 39.0–52.0)
Hemoglobin: 12.1 g/dL — ABNORMAL LOW (ref 13.0–17.0)
Immature Granulocytes: 0 %
Lymphocytes Relative: 37 %
Lymphs Abs: 2.5 10*3/uL (ref 0.7–4.0)
MCH: 26.1 pg (ref 26.0–34.0)
MCHC: 31 g/dL (ref 30.0–36.0)
MCV: 84.1 fL (ref 80.0–100.0)
Monocytes Absolute: 0.4 10*3/uL (ref 0.1–1.0)
Monocytes Relative: 7 %
Neutro Abs: 3.3 10*3/uL (ref 1.7–7.7)
Neutrophils Relative %: 49 %
Platelets: 322 10*3/uL (ref 150–400)
RBC: 4.64 MIL/uL (ref 4.22–5.81)
RDW: 12.7 % (ref 11.5–15.5)
WBC: 6.7 10*3/uL (ref 4.0–10.5)
nRBC: 0 % (ref 0.0–0.2)

## 2018-07-19 LAB — BASIC METABOLIC PANEL
ANION GAP: 7 (ref 5–15)
BUN: 13 mg/dL (ref 6–20)
CO2: 28 mmol/L (ref 22–32)
Calcium: 8.9 mg/dL (ref 8.9–10.3)
Chloride: 105 mmol/L (ref 98–111)
Creatinine, Ser: 0.95 mg/dL (ref 0.61–1.24)
GFR calc Af Amer: >60 mL/min (ref >60)
GFR calc non Af Amer: >60 mL/min (ref >60)
Glucose, Bld: 104 mg/dL — ABNORMAL HIGH (ref 70–99)
POTASSIUM: 3.3 mmol/L — AB (ref 3.5–5.1)
Sodium: 140 mmol/L (ref 135–145)

## 2018-07-19 LAB — TSH: TSH: 1.245 u[IU]/mL (ref 0.350–4.500)

## 2018-07-19 LAB — D-DIMER, QUANTITATIVE (NOT AT ARMC): D DIMER QUANT: 2.06 ug{FEU}/mL — AB (ref 0.00–0.50)

## 2018-07-19 MED ORDER — RIVAROXABAN (XARELTO) VTE STARTER PACK (15 & 20 MG): ORAL_TABLET | ORAL | 0 refills | Status: DC

## 2018-07-19 MED ORDER — RIVAROXABAN 15 MG PO TABS
15.0000 mg | ORAL_TABLET | Freq: Once | ORAL | Status: AC
  Administered 2018-07-19: 15 mg via ORAL
  Filled 2018-07-19: qty 1

## 2018-07-19 MED ORDER — RIVAROXABAN (XARELTO) EDUCATION KIT FOR AFIB PATIENTS: PACK | Freq: Once | Status: DC

## 2018-07-19 MED ORDER — RIVAROXABAN (XARELTO) EDUCATION KIT FOR DVT/PE PATIENTS
PACK | Freq: Once | Status: AC
  Administered 2018-07-19: 10:00:00

## 2018-07-19 NOTE — ED Triage Notes (Signed)
Patient states he was at [facility] and was to have a venous doppler study this am, was brought to ED with positive DVT study

## 2018-07-19 NOTE — Discharge Instructions (Addendum)
Please read attached information. If you experience any new or worsening signs or symptoms please return to the emergency room for evaluation. Please follow-up with your primary care provider or specialist as discussed. Please use medication prescribed only as directed and discontinue taking if you have any concerning signs or symptoms.   °

## 2018-07-19 NOTE — Progress Notes (Signed)
Left lower extremity venous duplex has been completed. Preliminary results can be found in CV Proc through chart review.  Results were given to the ED charge nurse, Irving Burton.  07/19/18 8:14 AM Olen Cordial RVT

## 2018-07-19 NOTE — ED Notes (Signed)
Pt given and verbalized understanding of d/c instructions and need for follow up at [facility] in the AM for a DVT rule-out ultrasound. Pt told to return if s/s worsen. No further questions or distress at this time

## 2018-07-19 NOTE — ED Provider Notes (Signed)
[facility]-EMERGENCY DEPT Provider Note   CSN: [id number] Arrival date & time: 07/18/18  2332    History   Chief Complaint Chief Complaint  Patient presents with  . Fatigue  . left leg pain    HPI Edward Barr is a 48 y.o. male.     Patient is a 48 year old male with no significant past medical history.  He presents today for evaluation of left calf pain.  This began 2 days ago in the absence of any injury or trauma.  He is concerned about a blood clot.  He denies any chest pain or difficulty breathing.  He does report that he has had "difficulty staying awake" for the past 2 days as well.  He states he feels unusually tired and falls asleep easily.  He denies any new medications, fevers, or other illness.  The history is provided by the patient.    History reviewed. No pertinent past medical history.  There are no active problems to display for this patient.   History reviewed. No pertinent surgical history.      Home Medications    Prior to Admission medications   Medication Sig Start Date End Date Taking? Authorizing Provider  amoxicillin (AMOXIL) 500 MG capsule Take 1 capsule (500 mg total) by mouth 3 (three) times daily. 05/11/15   Hayden Rasmussen, NP  aspirin 81 MG tablet Take 81 mg by mouth daily.    [provider]  Aspirin-Salicylamide-Caffeine (BC HEADACHE POWDER PO) Take 0.5 packets by mouth every 6 (six) hours as needed (for pain).    [provider]  diclofenac (CATAFLAM) 50 MG tablet Take 1 tablet (50 mg total) by mouth 3 (three) times daily. One tablet TID with food prn pain. 05/11/15   Hayden Rasmussen, NP  traMADol (ULTRAM) 50 MG tablet Take 1 tablet (50 mg total) by mouth every 6 (six) hours as needed. 05/11/15   Hayden Rasmussen, NP  traMADol (ULTRAM) 50 MG tablet Take 1 tablet (50 mg total) by mouth every 6 (six) hours as needed. 07/27/15   Marlon Pel, PA-C  promethazine (PHENERGAN) 25 MG tablet Take 1 tablet (25 mg total) by  mouth every 6 (six) hours as needed for nausea or vomiting. 01/25/14 04/10/14  Junious Silk, PA-C    Family History Family History  Problem Relation Age of Onset  . Diabetes Mother   . Heart Problems Mother   . Heart Problems Father     Social History Social History   Tobacco Use  . Smoking status: Never Smoker  . Smokeless tobacco: Never Used  Substance Use Topics  . Alcohol use: No  . Drug use: No     Allergies   Patient has no known allergies.   Review of Systems Review of Systems  All other systems reviewed and are negative.    Physical Exam Updated Vital Signs BP 110/66 (BP Location: Left Arm)   Pulse 79   Temp 98.5 F (36.9 C) (Oral)   Resp 16   SpO2 97%   Physical Exam Vitals signs and nursing note reviewed.  Constitutional:      General: He is not in acute distress.    Appearance: He is well-developed. He is not diaphoretic.  HENT:     Head: Normocephalic and atraumatic.  Neck:     Musculoskeletal: Normal range of motion and neck supple.  Cardiovascular:     Rate and Rhythm: Normal rate and regular rhythm.     Heart sounds: No murmur. No friction  rub.  Pulmonary:     Effort: Pulmonary effort is normal. No respiratory distress.     Breath sounds: Normal breath sounds. No wheezing or rales.  Abdominal:     General: Bowel sounds are normal. There is no distension.     Palpations: Abdomen is soft.     Tenderness: There is no abdominal tenderness.  Musculoskeletal: Normal range of motion.     Comments: There is tenderness to palpation in the left calf.  There is no swelling or edema of the leg.  DP pulses are easily palpable and motor and sensation is intact to the entire foot.  Skin:    General: Skin is warm and dry.  Neurological:     Mental Status: He is alert and oriented to person, place, and time.     Coordination: Coordination normal.      ED Treatments / Results  Labs (all labs ordered are listed, but only abnormal results are  displayed) Labs Reviewed  BASIC METABOLIC PANEL  CBC WITH DIFFERENTIAL/PLATELET  D-DIMER, QUANTITATIVE (NOT AT [facility])  TSH    EKG None  Radiology No results found.  Procedures Procedures (including critical care time)  Medications Ordered in ED Medications - No data to display   Initial Impression / Assessment and Plan / ED Course  I have reviewed the triage vital signs and the nursing notes.  Pertinent labs & imaging results that were available during my care of the patient were reviewed by me and considered in my medical decision making (see chart for details).  Patient presenting with complaints of increased fatigue and left calf pain.  I am uncertain as to the etiology of his fatigue and somnolence, however nothing appears emergent.  His CBC and basic metabolic panel are unremarkable as is TSH.  As far as the pain in the patient's calf goes, his d-dimer is elevated at 2 and will undergo an ultrasound in the vascular lab tomorrow to rule out DVT.  I highly doubt pulmonary embolism.  He is not complaining of any chest pain, shortness of breath and his oxygen saturations and heart rate are very normal.  Final Clinical Impressions(s) / ED Diagnoses   Final diagnoses:  None    ED Discharge Orders    None       Geoffery Lyons, MD 07/19/18 0230

## 2018-07-19 NOTE — ED Provider Notes (Signed)
[facility] EMERGENCY DEPARTMENT Provider Note   CSN: [id number] Arrival date & time: 07/19/18  0807    History   Chief Complaint Chief Complaint  Patient presents with   Leg Pain    HPI Edward Barr is a 48 y.o. male.     HPI   48 year old male presents today with complaints of DVT.  Patient was seen yesterday with 3 days of left calf pain and swelling.  Patient denies any trauma, denies any history of the same.  He was sent to ultrasound this morning was told he had a DVT.  Patient denies any medication use, denies any smoking history, no supplementation use.  No history of trauma, surgeries, prolonged immobilization, coagulopathies in him or any family members.  He denies any chest pain or shortness of breath.  He notes he is otherwise healthy.   History reviewed. No pertinent past medical history.  There are no active problems to display for this patient.   History reviewed. No pertinent surgical history.      Home Medications    Prior to Admission medications   Medication Sig Start Date End Date Taking? Authorizing Provider  amoxicillin (AMOXIL) 500 MG capsule Take 1 capsule (500 mg total) by mouth 3 (three) times daily. Patient not taking: Reported on 07/19/2018 05/11/15   Janne Napoleon, NP  diclofenac (CATAFLAM) 50 MG tablet Take 1 tablet (50 mg total) by mouth 3 (three) times daily. One tablet TID with food prn pain. Patient not taking: Reported on 07/19/2018 05/11/15   Janne Napoleon, NP  Rivaroxaban 15 & 20 MG TBPK Take as directed on package: Start with one 68m tablet by mouth twice a day with food. On Day 22, switch to one 263mtablet once a day with food. 07/19/18   Yeshaya Vath, JeDellis FilbertPA-C  traMADol (ULTRAM) 50 MG tablet Take 1 tablet (50 mg total) by mouth every 6 (six) hours as needed. Patient not taking: Reported on 07/19/2018 05/11/15   MaJanne NapoleonNP  traMADol (ULTRAM) 50 MG tablet Take 1 tablet (50 mg total) by mouth every 6 (six) hours as  needed. Patient not taking: Reported on 07/19/2018 07/27/15   GrDelos HaringPA-C    Family History Family History  Problem Relation Age of Onset   Diabetes Mother    Heart Problems Mother    Heart Problems Father     Social History Social History   Tobacco Use   Smoking status: Never Smoker   Smokeless tobacco: Never Used  Substance Use Topics   Alcohol use: No   Drug use: No     Allergies   Patient has no known allergies.   Review of Systems Review of Systems  All other systems reviewed and are negative.   Physical Exam Updated Vital Signs BP 121/79 (BP Location: Right Arm)    Pulse 75    Temp 98.2 F (36.8 C) (Oral)    Resp 16    Ht _0  (1.727 m)    Wt 72.1 kg    SpO2 100%    BMI 24.18 kg/m   Physical Exam Vitals signs and nursing note reviewed.  Constitutional:      Appearance: He is well-developed.  HENT:     Head: Normocephalic and atraumatic.  Eyes:     General: No scleral icterus.       Right eye: No discharge.        Left eye: No discharge.     Conjunctiva/sclera: Conjunctivae normal.  Pupils: Pupils are equal, round, and reactive to light.  Neck:     Musculoskeletal: Normal range of motion.     Vascular: No JVD.     Trachea: No tracheal deviation.  Cardiovascular:     Rate and Rhythm: Normal rate and regular rhythm.  Pulmonary:     Effort: Pulmonary effort is normal. No respiratory distress.     Breath sounds: Normal breath sounds. No stridor. No wheezing.  Musculoskeletal:     Comments: Minor edema noted of the left calf, tenderness to palpation of the calf, pedal pulse 2+, cap refill intact to all digits  Neurological:     Mental Status: He is alert and oriented to person, place, and time.     Coordination: Coordination normal.  Psychiatric:        Behavior: Behavior normal.        Thought Content: Thought content normal.        Judgment: Judgment normal.      ED Treatments / Results  Labs (all labs ordered are listed,  but only abnormal results are displayed) Labs Reviewed - No data to display  EKG None  Radiology Le Venous  Result Date: 07/19/2018  Lower Venous Study Indications: Pain.  Performing Technologist: Oliver Hum RVT  Examination Guidelines: A complete evaluation includes B-mode imaging, spectral Doppler, color Doppler, and power Doppler as needed of all accessible portions of each vessel. Bilateral testing is considered an integral part of a complete examination. Limited examinations for reoccurring indications may be performed as noted.  Right Venous Findings: +---+---------------+---------+-----------+----------+-------+      Compressibility Phasicity Spontaneity Properties Summary  +---+---------------+---------+-----------+----------+-------+  CFV Full            Yes       Yes                             +---+---------------+---------+-----------+----------+-------+  Left Venous Findings: +---------+---------------+---------+-----------+----------+-------+            Compressibility Phasicity Spontaneity Properties Summary  +---------+---------------+---------+-----------+----------+-------+  CFV       Full            Yes       Yes                             +---------+---------------+---------+-----------+----------+-------+  SFJ       Full                                                      +---------+---------------+---------+-----------+----------+-------+  FV Prox   Full                                                      +---------+---------------+---------+-----------+----------+-------+  FV Mid    Full                                                      +---------+---------------+---------+-----------+----------+-------+  FV Distal Full                                                      +---------+---------------+---------+-----------+----------+-------+  PFV       Full                                                       +---------+---------------+---------+-----------+----------+-------+  POP       Partial         Yes       Yes                    Acute    +---------+---------------+---------+-----------+----------+-------+  PTV       Full                                                      +---------+---------------+---------+-----------+----------+-------+  PERO      None                                             Acute    +---------+---------------+---------+-----------+----------+-------+  Gastroc   Full                                                      +---------+---------------+---------+-----------+----------+-------+    Summary: Right: No evidence of common femoral vein obstruction. Left: Findings consistent with acute deep vein thrombosis involving the left popliteal vein, and left gastrocnemius vein. No cystic structure found in the popliteal fossa.  *See table(s) above for measurements and observations.    Preliminary     Procedures Procedures (including critical care time)  Medications Ordered in ED Medications  rivaroxaban (XARELTO) Education Kit for DVT/PE patients (has no administration in time range)  Rivaroxaban (XARELTO) tablet 15 mg (15 mg Oral Given 07/19/18 0919)     Initial Impression / Assessment and Plan / ED Course  I have reviewed the triage vital signs and the nursing notes.  Pertinent labs & imaging results that were available during my care of the patient were reviewed by me and considered in my medical decision making (see chart for details).        48 year old male presents today with unprovoked DVT.  He has no signs of pulmonary embolism or large vein occlusion.  Uncertain etiology at this time.  Patient will need anticoagulation at this time with outpatient follow-up for ongoing management.  Patient counseled by pharmacy at bedside discharged with outpatient follow-up instructions, strict return precautions.  Verbalized understanding and agreement to today's plan had no  further questions or concerns.  Final Clinical Impressions(s) / ED Diagnoses   Final diagnoses:  Acute deep vein thrombosis (DVT) of proximal vein of left lower extremity [facility])    ED Discharge Orders         Ordered    Rivaroxaban 15 & 20 MG TBPK     07/19/18 0827           Okey Regal, PA-C 07/19/18 4503    Jola Schmidt, MD 07/19/18 1045

## 2018-07-19 NOTE — ED Notes (Signed)
Pt resting in bed comfortably. Labs sent down. No acute distress.

## 2018-07-19 NOTE — ED Notes (Signed)
Pt resting comfortably awaiting provider. No acute distress

## 2018-07-19 NOTE — ED Notes (Signed)
Positive left leg DVT per vascular

## 2018-07-19 NOTE — Discharge Instructions (Addendum)
Return tomorrow with a given time for an ultrasound of your leg to rule out a blood clot.  Follow-up with your primary doctor if your fatigue/sleepiness are not improving in the next few days.

## 2018-07-24 ENCOUNTER — Encounter (HOSPITAL_COMMUNITY): Payer: Self-pay | Admitting: Emergency Medicine

## 2018-07-24 ENCOUNTER — Emergency Department (HOSPITAL_COMMUNITY)
Admission: EM | Admit: 2018-07-24 | Discharge: 2018-07-24 | Disposition: A | Discharge status: Home / Self Care | Payer: Self-pay | Attending: Emergency Medicine | Admitting: Emergency Medicine

## 2018-07-24 ENCOUNTER — Telehealth (INDEPENDENT_AMBULATORY_CARE_PROVIDER_SITE_OTHER): Payer: Self-pay

## 2018-07-24 ENCOUNTER — Other Ambulatory Visit: Payer: Self-pay

## 2018-07-24 DIAGNOSIS — I824Z2 Acute embolism and thrombosis of unspecified deep veins of left distal lower extremity: Secondary | ICD-10-CM | POA: Insufficient documentation

## 2018-07-24 DIAGNOSIS — Z91120 Patient's intentional underdosing of medication regimen due to financial hardship: Secondary | ICD-10-CM | POA: Insufficient documentation

## 2018-07-24 DIAGNOSIS — T45526A Underdosing of antithrombotic drugs, initial encounter: Secondary | ICD-10-CM | POA: Insufficient documentation

## 2018-07-24 MED ORDER — OXYCODONE-ACETAMINOPHEN 5-325 MG PO TABS: 1.0000 | ORAL_TABLET | Freq: Once | ORAL | Status: DC

## 2018-07-24 MED ORDER — OXYCODONE-ACETAMINOPHEN 5-325 MG PO TABS: 1.0000 | ORAL_TABLET | Freq: Four times a day (QID) | ORAL | 0 refills | Status: DC | PRN

## 2018-07-24 NOTE — Telephone Encounter (Signed)
Call received from Oletta Cohn, RN CM requesting a hospital follow up appointment for a patient at [facility]. Marland Kitcheninformed her that an appointment has been scheduled for 07/31/2018 @  1510.

## 2018-07-24 NOTE — Discharge Instructions (Addendum)
Please read attached information. If you experience any new or worsening signs or symptoms please return to the emergency room for evaluation. Please follow-up with your primary care provider or specialist as discussed. Please use medication prescribed only as directed and discontinue taking if you have any concerning signs or symptoms.   °

## 2018-07-24 NOTE — ED Triage Notes (Signed)
Pt in w/pain to L upper and lower leg. Has confirmed DVT in the extremity, had 1st dose of Xarelto Saturday and was unable to fill remaining Rx d/t cost. Denies any sob, just has pain in LLE

## 2018-07-24 NOTE — TOC Transition Note (Signed)
Transition of Care [facility]) - CM/SW Discharge Note   Patient Details  Name: Edward Barr MRN: [id number] Date of Birth: [birth year]  Transition of Care [facility]) CM/SW Contact:  Edward Cohn, RN Phone Number: 07/24/2018, 2:00 PM   Clinical Narrative:    [facility] consulted regarding PCP/follow-up appt for pt.   Final next level of care: Home/Self Care Barriers to Discharge: No Barriers Identified   Patient Goals and CMS Choice Patient states their goals for this hospitalization and ongoing recovery are:: get medication      Discharge Placement                       Discharge Plan and Services   Discharge Planning Services: CM Consult, Medication Assistance, Follow-up appt scheduled              Edward Barr J. Edward Roers, RN, BSN, [place] [phone]  [facility] set up appointment with Edward Messing, PA-C at [facility] on 3/31 @ 3:10.  Spoke with pt at bedside and advised to please arrive 15 min early and take a picture ID and your current medications.  Pt verbalizes understanding of keeping appointment.          Social Determinants of Health (SDOH) Interventions     Readmission Risk Interventions No flowsheet data found.

## 2018-07-24 NOTE — ED Notes (Signed)
Patient verbalizes understanding of discharge instructions. Opportunity for questioning and answers were provided. Armband removed by staff, pt discharged from ED. Ambulated out to lobby  

## 2018-07-24 NOTE — ED Provider Notes (Signed)
[facility] EMERGENCY DEPARTMENT Provider Note   CSN: [id number] Arrival date & time: 07/24/18  1331    History   Chief Complaint Chief Complaint  Patient presents with  . L leg DVT    HPI Edward Barr is a 48 y.o. male.     HPI   48 year old male presents today with complaints of left-sided leg pain.  Patient was recent seen on 07/19/2018.  He was diagnosed with DVT of his left lower extremity.  He was discharged with Xarelto.  Patient notes that he is unable to afford this prescription.  He notes ongoing pain he denies any color changes to the lower extremity.  He continues to deny any chest pain shortness of breath or any signs or symptoms consistent with pulmonary embolism.  History reviewed. No pertinent past medical history.  There are no active problems to display for this patient.   History reviewed. No pertinent surgical history.      Home Medications    Prior to Admission medications   Medication Sig Start Date End Date Taking? Authorizing Provider  amoxicillin (AMOXIL) 500 MG capsule Take 1 capsule (500 mg total) by mouth 3 (three) times daily. Patient not taking: Reported on 07/19/2018 05/11/15   Hayden Rasmussen, NP  diclofenac (CATAFLAM) 50 MG tablet Take 1 tablet (50 mg total) by mouth 3 (three) times daily. One tablet TID with food prn pain. Patient not taking: Reported on 07/19/2018 05/11/15   Hayden Rasmussen, NP  Rivaroxaban 15 & 20 MG TBPK Take as directed on package: Start with one 15mg  tablet by mouth twice a day with food. On Day 22, switch to one 20mg  tablet once a day with food. 07/19/18   Jamiria Langill, Tinnie Gens, PA-C  traMADol (ULTRAM) 50 MG tablet Take 1 tablet (50 mg total) by mouth every 6 (six) hours as needed. Patient not taking: Reported on 07/19/2018 05/11/15   Hayden Rasmussen, NP  traMADol (ULTRAM) 50 MG tablet Take 1 tablet (50 mg total) by mouth every 6 (six) hours as needed. Patient not taking: Reported on 07/19/2018 07/27/15   Marlon Pel, PA-C     Family History Family History  Problem Relation Age of Onset  . Diabetes Mother   . Heart Problems Mother   . Heart Problems Father     Social History Social History   Tobacco Use  . Smoking status: Never Smoker  . Smokeless tobacco: Never Used  Substance Use Topics  . Alcohol use: No  . Drug use: No     Allergies   Patient has no known allergies.   Review of Systems Review of Systems  All other systems reviewed and are negative.    Physical Exam Updated Vital Signs BP (!) 132/91 (BP Location: Right Arm)   Pulse 83   Temp 97.9 F (36.6 C) (Oral)   Resp 17   Ht 5\' 8"  (1.727 m)   Wt 73 kg   SpO2 96%   BMI 24.48 kg/m   Physical Exam Vitals signs and nursing note reviewed.  Constitutional:      Appearance: He is well-developed.  HENT:     Head: Normocephalic and atraumatic.  Eyes:     General: No scleral icterus.       Right eye: No discharge.        Left eye: No discharge.     Conjunctiva/sclera: Conjunctivae normal.     Pupils: Pupils are equal, round, and reactive to light.  Neck:     Musculoskeletal: Normal range  of motion.     Vascular: No JVD.     Trachea: No tracheal deviation.  Pulmonary:     Effort: Pulmonary effort is normal.     Breath sounds: No stridor.  Musculoskeletal:     Comments: Left leg with edema, tenderness palpation of posterior calf, no skin or color changes.  Neurological:     Mental Status: He is alert and oriented to person, place, and time.     Coordination: Coordination normal.  Psychiatric:        Behavior: Behavior normal.        Thought Content: Thought content normal.        Judgment: Judgment normal.      ED Treatments / Results  Labs (all labs ordered are listed, but only abnormal results are displayed) Labs Reviewed - No data to display  EKG None  Radiology No results found.  Procedures Procedures (including critical care time)  Medications Ordered in ED Medications - No data to display    Initial Impression / Assessment and Plan / ED Course  I have reviewed the triage vital signs and the nursing notes.  Pertinent labs & imaging results that were available during my care of the patient were reviewed by me and considered in my medical decision making (see chart for details).        48 year old male presents today with ongoing complaints of DVT.  Patient was given a coupon for the Xarelto encouraged to take the medication as directed, primary care follow-up was arranged for him.  He has no signs of pulmonary embolism or complicating features.  He ensures that he will follow-up as directed, strict return precautions given.  He verbalized understanding and agreement to today's plan.  Final Clinical Impressions(s) / ED Diagnoses   Final diagnoses:  Acute deep vein thrombosis (DVT) of distal vein of left lower extremity [facility])    ED Discharge Orders    None       Eyvonne Mechanic, PA-C 07/24/18 1359    Gwyneth Sprout, MD 07/25/18 [phone]

## 2018-07-30 ENCOUNTER — Emergency Department (HOSPITAL_COMMUNITY): Payer: Self-pay

## 2018-07-30 ENCOUNTER — Encounter (HOSPITAL_COMMUNITY): Payer: Self-pay | Admitting: Emergency Medicine

## 2018-07-30 ENCOUNTER — Emergency Department (HOSPITAL_COMMUNITY)
Admission: EM | Admit: 2018-07-30 | Discharge: 2018-07-30 | Disposition: A | Discharge status: Home / Self Care | Payer: Self-pay | Attending: Emergency Medicine | Admitting: Emergency Medicine

## 2018-07-30 DIAGNOSIS — Z86718 Personal history of other venous thrombosis and embolism: Secondary | ICD-10-CM | POA: Insufficient documentation

## 2018-07-30 DIAGNOSIS — Z79899 Other long term (current) drug therapy: Secondary | ICD-10-CM | POA: Insufficient documentation

## 2018-07-30 DIAGNOSIS — R0789 Other chest pain: Secondary | ICD-10-CM | POA: Insufficient documentation

## 2018-07-30 HISTORY — DX: Acute embolism and thrombosis of unspecified deep veins of unspecified lower extremity: I82.409

## 2018-07-30 HISTORY — DX: Essential (primary) hypertension: I10

## 2018-07-30 LAB — CBC
HCT: 44.9 % (ref 39.0–52.0)
Hemoglobin: 13.9 g/dL (ref 13.0–17.0)
MCH: 25.3 pg — ABNORMAL LOW (ref 26.0–34.0)
MCHC: 31 g/dL (ref 30.0–36.0)
MCV: 81.8 fL (ref 80.0–100.0)
PLATELETS: 476 10*3/uL — AB (ref 150–400)
RBC: 5.49 MIL/uL (ref 4.22–5.81)
RDW: 12.6 % (ref 11.5–15.5)
WBC: 6.5 10*3/uL (ref 4.0–10.5)
nRBC: 0 % (ref 0.0–0.2)

## 2018-07-30 LAB — BASIC METABOLIC PANEL
Anion gap: 13 (ref 5–15)
BUN: 19 mg/dL (ref 6–20)
CO2: 21 mmol/L — ABNORMAL LOW (ref 22–32)
Calcium: 9.7 mg/dL (ref 8.9–10.3)
Chloride: 101 mmol/L (ref 98–111)
Creatinine, Ser: 1.25 mg/dL — ABNORMAL HIGH (ref 0.61–1.24)
GFR calc Af Amer: >60 mL/min (ref >60)
GFR calc non Af Amer: >60 mL/min (ref >60)
Glucose, Bld: 199 mg/dL — ABNORMAL HIGH (ref 70–99)
Potassium: 3.7 mmol/L (ref 3.5–5.1)
Sodium: 135 mmol/L (ref 135–145)

## 2018-07-30 LAB — TROPONIN I
Troponin I: <0.03 ng/mL (ref <0.03)
Troponin I: <0.03 ng/mL (ref <0.03)

## 2018-07-30 MED ORDER — METHOCARBAMOL 500 MG PO TABS
500.0000 mg | ORAL_TABLET | Freq: Once | ORAL | Status: AC
  Administered 2018-07-30: 500 mg via ORAL
  Filled 2018-07-30: qty 1

## 2018-07-30 MED ORDER — RIVAROXABAN 15 MG PO TABS
15.0000 mg | ORAL_TABLET | Freq: Once | ORAL | Status: AC
  Administered 2018-07-30: 15 mg via ORAL
  Filled 2018-07-30: qty 1

## 2018-07-30 MED ORDER — METHOCARBAMOL 500 MG PO TABS: 500.0000 mg | ORAL_TABLET | Freq: Three times a day (TID) | ORAL | 0 refills | Status: AC | PRN

## 2018-07-30 MED ORDER — METHOCARBAMOL 1000 MG/10ML IJ SOLN: 500.0000 mg | Freq: Once | INTRAMUSCULAR | Status: DC

## 2018-07-30 MED ORDER — IOHEXOL 300 MG/ML  SOLN
100.0000 mL | Freq: Once | INTRAMUSCULAR | Status: AC | PRN
  Administered 2018-07-30: 100 mL via INTRAVENOUS

## 2018-07-30 MED ORDER — SODIUM CHLORIDE 0.9 % IV BOLUS
1000.0000 mL | Freq: Once | INTRAVENOUS | Status: AC
  Administered 2018-07-30: 1000 mL via INTRAVENOUS

## 2018-07-30 MED ORDER — RIVAROXABAN (XARELTO) VTE STARTER PACK (15 & 20 MG): ORAL_TABLET | ORAL | 0 refills | Status: DC

## 2018-07-30 NOTE — ED Notes (Signed)
Discharge instructions discussed with Pt and [employer]. Pt verbalized understanding. Pt stable and leaving via [employer].

## 2018-07-30 NOTE — ED Triage Notes (Signed)
Pt here via [facility] in [employer] custody, pt began having stabbing left sided CP 4 hours ago, reports SOB. Lungs clear. Pt given 84 of ASA-refused the rest.

## 2018-07-30 NOTE — Discharge Instructions (Addendum)
For your chest pain: - Tylenol 500 mg every 4 hours PRN pain - Robaxin 500 mg every 8 hours PRN pain, spasms  For your blood clot: - IT IS CRITICAL THAT YOU TAKE YOUR XARELTO AS PRESCRIBED.

## 2018-07-30 NOTE — ED Provider Notes (Signed)
[facility] EMERGENCY DEPARTMENT Provider Note   CSN: [id number] Arrival date & time: 07/30/18  1940    History   Chief Complaint Chief Complaint  Patient presents with  . Chest Pain  . Shortness of Breath    HPI Edward Barr is a 48 y.o. male.     HPI 48 year old male with past medical history as below here with chest pain.  The patient was recently seen and evaluated for DVT.  He has not been compliant with his Xarelto.  He states that over the last 4 hours, he has had constant, sharp, positional, chest pain.  He states that he believes it was from how he was sleeping, but he mentioned it to police after he was arrested today.  He has multiple warrants out and subsequently will be imprisoned for an unknown amount of time.  Pain is worse with certain movements.  No alleviating factors.  No fever.  No hemoptysis.  No lower extremity swelling.  He does have some left leg pain which is secondary to his DVT, and he admits he has not been compliant with Xarelto.  No other complaints.    Past Medical History:  Diagnosis Date  . DVT (deep venous thrombosis) (HCC) 2020  . Hypertension     There are no active problems to display for this patient.   History reviewed. No pertinent surgical history.      Home Medications    Prior to Admission medications   Medication Sig Start Date End Date Taking? Authorizing Provider  methocarbamol (ROBAXIN) 500 MG tablet Take 1 tablet (500 mg total) by mouth every 8 (eight) hours as needed for up to 7 days for muscle spasms (chest pain). 07/30/18 08/06/18  Shaune Pollack, MD  oxyCODONE-acetaminophen (PERCOCET/ROXICET) 5-325 MG tablet Take 1 tablet by mouth every 6 (six) hours as needed. Patient not taking: Reported on 07/30/2018 07/24/18   Eyvonne Mechanic, PA-C  Rivaroxaban 15 & 20 MG TBPK Take as directed on package: Start with one 15mg  tablet by mouth twice a day with food. On Day 22, switch to one 20mg  tablet once a day with  food. 07/30/18   Shaune Pollack, MD    Family History Family History  Problem Relation Age of Onset  . Diabetes Mother   . Heart Problems Mother   . Heart Problems Father     Social History Social History   Tobacco Use  . Smoking status: Never Smoker  . Smokeless tobacco: Never Used  Substance Use Topics  . Alcohol use: No  . Drug use: No     Allergies   Pork-derived products   Review of Systems Review of Systems  Constitutional: Negative for chills, fatigue and fever.  HENT: Negative for congestion and rhinorrhea.   Eyes: Negative for visual disturbance.  Respiratory: Positive for chest tightness. Negative for cough, shortness of breath and wheezing.   Cardiovascular: Positive for chest pain and leg swelling.  Gastrointestinal: Negative for abdominal pain, diarrhea, nausea and vomiting.  Genitourinary: Negative for dysuria and flank pain.  Musculoskeletal: Negative for neck pain and neck stiffness.  Skin: Negative for rash and wound.  Allergic/Immunologic: Negative for immunocompromised state.  Neurological: Negative for syncope, weakness and headaches.  All other systems reviewed and are negative.    Physical Exam Updated Vital Signs BP 111/75   Pulse 75   Temp 98.3 F (36.8 C) (Oral)   Resp 17   SpO2 98%   Physical Exam Vitals signs and nursing note reviewed.  Constitutional:      General: He is not in acute distress.    Appearance: He is well-developed.  HENT:     Head: Normocephalic and atraumatic.  Eyes:     Conjunctiva/sclera: Conjunctivae normal.  Neck:     Musculoskeletal: Neck supple.  Cardiovascular:     Rate and Rhythm: Normal rate and regular rhythm.     Heart sounds: Normal heart sounds. No murmur. No friction rub.  Pulmonary:     Effort: Pulmonary effort is normal. No respiratory distress.     Breath sounds: Normal breath sounds. No wheezing or rales.     Comments: Minimal TTP over anterior chest wall Abdominal:     General: There  is no distension.     Palpations: Abdomen is soft.     Tenderness: There is no abdominal tenderness.  Musculoskeletal:     Comments: No significant edema or asymmetry. No palpable cords.  Skin:    General: Skin is warm.     Capillary Refill: Capillary refill takes less than 2 seconds.  Neurological:     Mental Status: He is alert and oriented to person, place, and time.     Motor: No abnormal muscle tone.      ED Treatments / Results  Labs (all labs ordered are listed, but only abnormal results are displayed) Labs Reviewed  BASIC METABOLIC PANEL - Abnormal; Notable for the following components:      Result Value   CO2 21 (*)    Glucose, Bld 199 (*)    Creatinine, Ser 1.25 (*)    All other components within normal limits  CBC - Abnormal; Notable for the following components:   MCH 25.3 (*)    Platelets 476 (*)    All other components within normal limits  TROPONIN I  TROPONIN I  I-STAT TROPONIN, ED    EKG EKG Interpretation  Date/Time:  Monday July 30 2018 19:47:39 EDT Ventricular Rate:  91 PR Interval:    QRS Duration: 80 QT Interval:  341 QTC Calculation: 420 R Axis:   107 Text Interpretation:  Sinus rhythm Right axis deviation Probable anteroseptal infarct, old No significant change since last tracing Confirmed by Shaune Pollack [phone]) on 07/30/2018 9:04:20 PM   Radiology Ct Angio Chest Pe W Or Wo Contrast  Result Date: 07/30/2018 CLINICAL DATA:  48 year old male with left-sided chest pain and shortness of breath. Concern for pulmonary embolism. EXAM: CT ANGIOGRAPHY CHEST WITH CONTRAST TECHNIQUE: Multidetector CT imaging of the chest was performed using the standard protocol during bolus administration of intravenous contrast. Multiplanar CT image reconstructions and MIPs were obtained to evaluate the vascular anatomy. CONTRAST:  OMNIPAQUE IOHEXOL 300 MG/ML  SOLN COMPARISON:  Chest radiograph dated 07/22/2006 FINDINGS: Cardiovascular: Borderline  cardiomegaly. No pericardial effusion. The thoracic aorta is unremarkable. There is a thin nonocclusive band like structure in the right lower lobe pulmonary artery most likely representing scarring or possibly related to an old embolus. No acute pulmonary artery embolus identified. Mediastinum/Nodes: Mildly enlarged right hilar lymph node measures 15 mm. The esophagus and the thyroid gland are grossly unremarkable as visualized. No mediastinal fluid collection. Lungs/Pleura: The lungs are clear. There is no pleural effusion or pneumothorax. The central airways are patent. Upper Abdomen: No acute abnormality. Musculoskeletal: No chest wall abnormality. No acute or significant osseous findings. Review of the MIP images confirms the above findings. IMPRESSION: 1. No acute intrathoracic pathology. No CT evidence of acute pulmonary artery embolus. 2. 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 Electronically Signed   By: Elgie CollardArash  Radparvar M.D.   On: 07/30/2018 21:38    Procedures Procedures (including critical care time)  Medications Ordered in ED Medications  iohexol (OMNIPAQUE) 300 MG/ML solution 100 mL (100 mLs Intravenous Contrast Given 07/30/18 2124)  Rivaroxaban (XARELTO) tablet 15 mg (15 mg Oral Given 07/30/18 2219)  sodium chloride 0.9 % bolus 1,000 mL (0 mLs Intravenous Stopped 07/30/18 2307)  methocarbamol (ROBAXIN) tablet 500 mg (500 mg Oral Given 07/30/18 2222)     Initial Impression / Assessment and Plan / ED Course  I have reviewed the triage vital signs and the nursing notes.  Pertinent labs & imaging results that were available during my care of the patient were reviewed by me and considered in my medical decision making (see chart for details).       [age] yo M with PMHx as above here with atypical chest pain, mild leg pain. Suspect this is MSK 2/2 sleeping in his car, also consideration of possible stress/anxiety from arrest. He is not  hypoxic, tachycardic,a nd is otherwise well appearing. CT Angio is negative. Labs are reassuring. Trop neg despite constant sx >24 hours, normal EKG and I do not suspect this is ACS. Pain not c/w dissection. D/c to facility with close Xarelto follow-up.  Final Clinical Impressions(s) / ED Diagnoses   Final diagnoses:  Atypical chest pain  History of DVT (deep vein thrombosis)    ED Discharge Orders         Ordered    Rivaroxaban 15 & 20 MG TBPK     07/30/18 2251    methocarbamol (ROBAXIN) 500 MG tablet  Every 8 hours PRN     07/30/18 2251           Shaune PollackIsaacs, Brynlee Pennywell, MD 07/30/18 2307

## 2018-07-31 ENCOUNTER — Inpatient Hospital Stay: Payer: Self-pay | Admitting: Primary Care

## 2018-07-31 ENCOUNTER — Ambulatory Visit (INDEPENDENT_AMBULATORY_CARE_PROVIDER_SITE_OTHER): Payer: Self-pay | Admitting: Primary Care

## 2018-11-21 ENCOUNTER — Other Ambulatory Visit: Payer: Self-pay

## 2018-11-21 ENCOUNTER — Encounter (HOSPITAL_COMMUNITY): Payer: Self-pay

## 2018-11-21 ENCOUNTER — Ambulatory Visit (HOSPITAL_COMMUNITY)
Admission: EM | Admit: 2018-11-21 | Discharge: 2018-11-21 | Disposition: A | Discharge status: Home / Self Care | Payer: Self-pay | Attending: Family Medicine | Admitting: Family Medicine

## 2018-11-21 DIAGNOSIS — K047 Periapical abscess without sinus: Secondary | ICD-10-CM

## 2018-11-21 MED ORDER — PENICILLIN V POTASSIUM 500 MG PO TABS: 500.0000 mg | ORAL_TABLET | Freq: Four times a day (QID) | ORAL | 0 refills | Status: AC

## 2018-11-21 MED ORDER — HYDROCODONE-ACETAMINOPHEN 5-325 MG PO TABS
1.0000 | ORAL_TABLET | Freq: Once | ORAL | Status: AC
  Administered 2018-11-21: 16:00:00 1 via ORAL

## 2018-11-21 MED ORDER — ACETAMINOPHEN 500 MG PO TABS: 500.0000 mg | ORAL_TABLET | Freq: Four times a day (QID) | ORAL | 0 refills | Status: DC | PRN

## 2018-11-21 MED ORDER — HYDROCODONE-ACETAMINOPHEN 5-325 MG PO TABS
ORAL_TABLET | ORAL | Status: AC
  Filled 2018-11-21: qty 1

## 2018-11-21 NOTE — Discharge Instructions (Addendum)
Warm salt water rinses Penicillin for the infection Extra strength Tylenol at home.  You can take 2 tabs every 8 hours Hydrocodone given here for pain Follow up as needed for continued or worsening symptoms Dental resources attached to discharge directions for follow up

## 2018-11-21 NOTE — ED Provider Notes (Signed)
[facility]    CSN: [id number] Arrival date & time: 11/21/18  1342     History   Chief Complaint Chief Complaint  Patient presents with  . Abscess    HPI Edward Barr is a 48 y.o. male.   Patient is a 48 year old male past medical history of hypertension and DVT.  He presents today with right lower dental pain.  This is been present and worsening over the past 2 days.  He has abscess to tooth.  There is partial fracture of tooth.  He does not currently have a dentist.  Denies any associated fevers, chills, trismus.  He has not take anything for pain.  ROS per HPI      Past Medical History:  Diagnosis Date  . DVT (deep venous thrombosis) (HCC) 2020  . Hypertension     There are no active problems to display for this patient.   History reviewed. No pertinent surgical history.     Home Medications    Prior to Admission medications   Medication Sig Start Date End Date Taking? Authorizing Provider  acetaminophen (TYLENOL) 500 MG tablet Take 1 tablet (500 mg total) by mouth every 6 (six) hours as needed. 11/21/18   Dahlia ByesBast, Dezmond Downie A, NP  penicillin v potassium (VEETID) 500 MG tablet Take 1 tablet (500 mg total) by mouth 4 (four) times daily for 10 days. 11/21/18 12/01/18  Janace ArisBast, Hipolito Martinezlopez A, NP  Rivaroxaban 15 & 20 MG TBPK Take as directed on package: Start with one 15mg  tablet by mouth twice a day with food. On Day 22, switch to one 20mg  tablet once a day with food. 07/30/18   Shaune PollackIsaacs, Cameron, MD    Family History Family History  Problem Relation Age of Onset  . Diabetes Mother   . Heart Problems Mother   . Heart Problems Father     Social History Social History   Tobacco Use  . Smoking status: Never Smoker  . Smokeless tobacco: Never Used  Substance Use Topics  . Alcohol use: No  . Drug use: No     Allergies   Pork-derived products   Review of Systems Review of Systems   Physical Exam Triage Vital Signs ED Triage Vitals  Enc Vitals Group   BP 11/21/18 1445 113/70     Pulse Rate 11/21/18 1445 68     Resp 11/21/18 1445 14     Temp 11/21/18 1445 98 F (36.7 C)     Temp Source 11/21/18 1445 Oral     SpO2 11/21/18 1445 100 %     Weight 11/21/18 1453 160 lb (72.6 kg)     Height --      Head Circumference --      Peak Flow --      Pain Score 11/21/18 1453 10     Pain Loc --      Pain Edu? --      Excl. in GC? --    No data found.  Updated Vital Signs BP 113/70 (BP Location: Left Arm)   Pulse 68   Temp 98 F (36.7 C) (Oral)   Resp 14   Wt 160 lb (72.6 kg)   SpO2 100%   BMI 24.33 kg/m   Visual Acuity Right Eye Distance:   Left Eye Distance:   Bilateral Distance:    Right Eye Near:   Left Eye Near:    Bilateral Near:     Physical Exam Vitals signs and nursing note reviewed.  Constitutional:  Appearance: Normal appearance.  HENT:     Head: Normocephalic and atraumatic.     Nose: Nose normal.     Mouth/Throat:     Dentition: Dental tenderness, gingival swelling, dental caries and dental abscesses present.     Pharynx: Oropharynx is clear. Uvula midline.   Eyes:     Conjunctiva/sclera: Conjunctivae normal.  Neck:     Musculoskeletal: Normal range of motion.  Pulmonary:     Effort: Pulmonary effort is normal.  Musculoskeletal: Normal range of motion.  Lymphadenopathy:     Cervical: No cervical adenopathy.  Skin:    General: Skin is warm and dry.  Neurological:     Mental Status: He is alert.  Psychiatric:        Mood and Affect: Mood normal.      UC Treatments / Results  Labs (all labs ordered are listed, but only abnormal results are displayed) Labs Reviewed - No data to display  EKG   Radiology No results found.  Procedures Procedures (including critical care time)  Medications Ordered in UC Medications  HYDROcodone-acetaminophen (NORCO/VICODIN) 5-325 MG per tablet 1 tablet (has no administration in time range)    Initial Impression / Assessment and Plan / UC Course  I  have reviewed the triage vital signs and the nursing notes.  Pertinent labs & imaging results that were available during my care of the patient were reviewed by me and considered in my medical decision making (see chart for details).     Treating for dental infection with penicillin.  Giving hydrocodone here in clinic for pain Recommended taking Tylenol at home for pain.  He can take up to 1,000 mg  every 8 hours as needed. Warm salt water rinses Dental resources given for follow-up Final Clinical Impressions(s) / UC Diagnoses   Final diagnoses:  Dental abscess     Discharge Instructions     Warm salt water rinses Penicillin for the infection Extra strength Tylenol at home.  You can take 2 tabs every 8 hours Hydrocodone given here for pain Follow up as needed for continued or worsening symptoms Dental resources attached to discharge directions for follow up       ED Prescriptions    Medication Sig Dispense Auth. Provider   penicillin v potassium (VEETID) 500 MG tablet Take 1 tablet (500 mg total) by mouth 4 (four) times daily for 10 days. 40 tablet Lupita Rosales A, NP   acetaminophen (TYLENOL) 500 MG tablet Take 1 tablet (500 mg total) by mouth every 6 (six) hours as needed. 30 tablet Loura Halt A, NP     Controlled Substance Prescriptions [place] Controlled Substance Registry consulted? Not Applicable   Orvan July, NP 11/21/18 1523

## 2018-11-21 NOTE — ED Triage Notes (Signed)
Pt states he has a toothache. Pt states abscess on the right side of his mouth.

## 2019-06-03 ENCOUNTER — Other Ambulatory Visit: Payer: Self-pay | Admitting: Critical Care Medicine

## 2019-06-03 DIAGNOSIS — Z20822 Contact with and (suspected) exposure to covid-19: Secondary | ICD-10-CM

## 2019-06-04 LAB — NOVEL CORONAVIRUS, NAA: SARS-CoV-2, NAA: NOT DETECTED

## 2019-08-22 ENCOUNTER — Ambulatory Visit: Payer: Self-pay | Attending: Internal Medicine

## 2019-08-22 DIAGNOSIS — Z23 Encounter for immunization: Secondary | ICD-10-CM

## 2019-08-22 NOTE — Progress Notes (Signed)
   Covid-19 Vaccination Clinic  Name:  Edward Barr    MRN: [id number] DOB: November 27, 1970  08/22/2019  Edward Barr was observed post Covid-19 immunization for 15 minutes without incident. He was provided with Vaccine Information Sheet and instruction to access the V-Safe system.   Edward Barr was instructed to call [phone] with any severe reactions post vaccine: Marland Kitchen Difficulty breathing  . Swelling of face and throat  . A fast heartbeat  . A bad rash all over body  . Dizziness and weakness   Immunizations Administered    Name Date Dose VIS Date Route   Pfizer COVID-19 Vaccine 08/22/2019  1:52 PM 0.3 mL 06/26/2018 Intramuscular   Manufacturer: [employer], [employer]   Lot: [id number]   NDC: [address]-4

## 2019-09-16 ENCOUNTER — Ambulatory Visit: Payer: Self-pay | Attending: Internal Medicine

## 2020-09-21 ENCOUNTER — Encounter (HOSPITAL_COMMUNITY): Payer: Self-pay

## 2021-02-22 ENCOUNTER — Emergency Department (HOSPITAL_COMMUNITY)
Admission: EM | Admit: 2021-02-22 | Discharge: 2021-02-23 | Disposition: A | Discharge status: Home / Self Care | Payer: Self-pay | Attending: Emergency Medicine | Admitting: Emergency Medicine

## 2021-02-22 DIAGNOSIS — M25522 Pain in left elbow: Secondary | ICD-10-CM | POA: Insufficient documentation

## 2021-02-22 DIAGNOSIS — I10 Essential (primary) hypertension: Secondary | ICD-10-CM | POA: Insufficient documentation

## 2021-02-22 DIAGNOSIS — R0789 Other chest pain: Secondary | ICD-10-CM | POA: Insufficient documentation
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
## 2021-02-23 ENCOUNTER — Other Ambulatory Visit: Payer: Self-pay

## 2021-02-23 ENCOUNTER — Emergency Department (HOSPITAL_COMMUNITY): Payer: Self-pay

## 2021-02-23 DIAGNOSIS — R1012 Left upper quadrant pain: Secondary | ICD-10-CM | POA: Insufficient documentation

## 2021-02-23 DIAGNOSIS — Z5321 Procedure and treatment not carried out due to patient leaving prior to being seen by health care provider: Secondary | ICD-10-CM | POA: Insufficient documentation

## 2021-02-23 DIAGNOSIS — R0789 Other chest pain: Secondary | ICD-10-CM | POA: Insufficient documentation

## 2021-02-23 MED ORDER — NAPROXEN 250 MG PO TABS
500.0000 mg | ORAL_TABLET | Freq: Once | ORAL | Status: AC
  Administered 2021-02-23: 500 mg via ORAL
  Filled 2021-02-23: qty 2

## 2021-02-23 MED ORDER — IBUPROFEN 600 MG PO TABS: 600.0000 mg | ORAL_TABLET | Freq: Four times a day (QID) | ORAL | 0 refills | Status: DC | PRN
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
## 2021-02-23 NOTE — ED Provider Notes (Signed)
[facility] EMERGENCY DEPARTMENT Provider Note   CSN: [id number] Arrival date & time: 02/22/21  2145     History Chief Complaint  Patient presents with   Chest Pain    Edward Barr is a 50 y.o. male.  HPI     Is a 50 year old male with a history of hypertension and DVT who presents with chest wall pain and left-sided rib pain.  Patient recently incarcerated.  He has had pain over the left lower ribs for several weeks and has noted a knot which he feels is a Engineer, production.  He also states that his left arm has been bothering him for 1 month.  It is worse with range of motion.  He denies any injury.  Patient has taken some ibuprofen with relief.  Denies shortness of breath or cough.  Currently he rates his pain at 8 out of 10.  Past Medical History:  Diagnosis Date   DVT (deep venous thrombosis) (HCC) 2020   Hypertension     There are no problems to display for this patient.   No past surgical history on file.     Family History  Problem Relation Age of Onset   Diabetes Mother    Heart Problems Mother    Heart Problems Father     Social History   Tobacco Use   Smoking status: Never   Smokeless tobacco: Never  Substance Use Topics   Alcohol use: No   Drug use: No    Home Medications Prior to Admission medications   Medication Sig Start Date End Date Taking? Authorizing Provider  ibuprofen (ADVIL) 600 MG tablet Take 1 tablet (600 mg total) by mouth every 6 (six) hours as needed. 02/23/21  Yes Cerenity Goshorn, Mayer Masker, MD  acetaminophen (TYLENOL) 500 MG tablet Take 1 tablet (500 mg total) by mouth every 6 (six) hours as needed. 11/21/18   Janace Aris, NP  Rivaroxaban 15 & 20 MG TBPK Take as directed on package: Start with one 15mg  tablet by mouth twice a day with food. On Day 22, switch to one 20mg  tablet once a day with food. 07/30/18   , MD    Allergies    Pork-derived products  Review of Systems   Review of Systems  Constitutional:   Negative for fever.  Respiratory:  Negative for cough and shortness of breath.   Cardiovascular:  Positive for chest pain.  Gastrointestinal:  Negative for abdominal pain.  Genitourinary:  Negative for dysuria.  Musculoskeletal:        Elbow pain  All other systems reviewed and are negative.  Physical Exam Updated Vital Signs BP 127/78 (BP Location: Right Arm)   Pulse 84   Temp 98.4 F (36.9 C) (Oral)   Resp 16   Ht 1.727 m (5\' 8" )   Wt 71.7 kg   SpO2 100%   BMI 24.02 kg/m   Physical Exam Vitals and nursing note reviewed.  Constitutional:      Appearance: He is well-developed. He is not ill-appearing.  HENT:     Head: Normocephalic and atraumatic.  Eyes:     Pupils: Pupils are equal, round, and reactive to light.  Cardiovascular:     Rate and Rhythm: Normal rate and regular rhythm.     Heart sounds: Normal heart sounds. No murmur heard. Pulmonary:     Effort: Pulmonary effort is normal. No respiratory distress.     Breath sounds: Normal breath sounds. No wheezing.  Chest:  Chest wall: Tenderness present.     Comments: Tender to palpation left lower rib cage with no palpable mass, no overlying skin changes or erythema, no crepitus Abdominal:     General: Bowel sounds are normal.     Palpations: Abdomen is soft.     Tenderness: There is no abdominal tenderness. There is no rebound.  Musculoskeletal:     Cervical back: Neck supple.     Comments: Normal range of motion left elbow, no erythema, no effusion  Lymphadenopathy:     Cervical: No cervical adenopathy.  Skin:    General: Skin is warm and dry.  Neurological:     Mental Status: He is alert and oriented to person, place, and time.  Psychiatric:        Mood and Affect: Mood normal.    ED Results / Procedures / Treatments   Labs (all labs ordered are listed, but only abnormal results are displayed) Labs Reviewed - No data to display  EKG None  Radiology DG Ribs Unilateral W/Chest Left  Result Date:  02/23/2021 CLINICAL DATA:  Left-sided rib pain. EXAM: LEFT RIBS AND CHEST - 3+ VIEW COMPARISON:  July 22, 2006 FINDINGS: No fracture or other bone lesions are seen involving the ribs. There is no evidence of pneumothorax or pleural effusion. Both lungs are clear. Heart size and mediastinal contours are within normal limits. IMPRESSION: Negative. Electronically Signed   By: Aram Candela M.D.   On: 02/23/2021 00:19   DG Elbow Complete Left  Result Date: 02/23/2021 CLINICAL DATA:  Left elbow pain. EXAM: LEFT ELBOW - COMPLETE 3+ VIEW COMPARISON:  None. FINDINGS: There is no evidence of fracture, dislocation, or joint effusion. There is no evidence of arthropathy or other focal bone abnormality. Soft tissues are unremarkable. IMPRESSION: Negative. Electronically Signed   By: Aram Candela M.D.   On: 02/23/2021 00:20    Procedures Procedures   Medications Ordered in ED Medications  naproxen (NAPROSYN) tablet 500 mg (has no administration in time range)    ED Course  I have reviewed the triage vital signs and the nursing notes.  Pertinent labs & imaging results that were available during my care of the patient were reviewed by me and considered in my medical decision making (see chart for details).    MDM Rules/Calculators/A&P                           Patient presents with tenderness palpation left lower chest wall over the last 3 to 4 weeks.  In addition has pain in the left elbow.  He denies injury.  He is nontoxic and vital signs are reassuring.  Pain is reproducible in the left lower chest wall as his elbow pain.  No signs or symptoms of septic joint.  Inflammatory arthritis would be consideration as related to occult injury although he denies this in his history.  X-rays of the chest and left elbow obtained and are negative for acute fracture.  Will discharge home with ibuprofen.  After history, exam, and medical workup I feel the patient has been appropriately medically screened  and is safe for discharge home. Pertinent diagnoses were discussed with the patient. Patient was given return precautions.  Final Clinical Impression(s) / ED Diagnoses Final diagnoses:  Chest wall pain  Elbow pain, left    Rx / DC Orders ED Discharge Orders          Ordered    ibuprofen (ADVIL) 600 MG  tablet  Every 6 hours PRN        02/23/21 0426             Shon Baton, MD 02/23/21 [phone]

## 2021-02-23 NOTE — Discharge Instructions (Signed)
You were seen today for musculoskeletal pain.  Take ibuprofen as needed.  Your x-rays do not show any fractures.

## 2021-02-24 ENCOUNTER — Other Ambulatory Visit: Payer: Self-pay

## 2021-02-24 ENCOUNTER — Emergency Department (HOSPITAL_COMMUNITY)
Admission: EM | Admit: 2021-02-24 | Discharge: 2021-02-24 | Disposition: A | Discharge status: Home / Self Care | Payer: Self-pay | Attending: Emergency Medicine | Admitting: Emergency Medicine

## 2021-02-24 LAB — I-STAT CHEM 8, ED
BUN: 17 mg/dL (ref 6–20)
Calcium, Ion: 1.24 mmol/L (ref 1.15–1.40)
Chloride: 101 mmol/L (ref 98–111)
Creatinine, Ser: 0.7 mg/dL (ref 0.61–1.24)
Glucose, Bld: 100 mg/dL — ABNORMAL HIGH (ref 70–99)
HCT: 36 % — ABNORMAL LOW (ref 39.0–52.0)
Hemoglobin: 12.2 g/dL — ABNORMAL LOW (ref 13.0–17.0)
Potassium: 3.5 mmol/L (ref 3.5–5.1)
Sodium: 141 mmol/L (ref 135–145)
TCO2: 29 mmol/L (ref 22–32)

## 2021-02-24 NOTE — ED Notes (Signed)
Called several times for vitals and no response

## 2021-02-24 NOTE — ED Provider Notes (Signed)
Emergency Medicine Provider Triage Evaluation Note  Elwyn Klosinski , a 50 y.o. male  was evaluated in triage.  Pt complains of L lower chest wall mass. States he returns to the ED because "it is getting bigger" since his visit last night. Has some improvement with ibuprofen. No trauma or injury since ED discharge. Denies N/V/D, fevers.  Review of Systems  Positive: L lower chest wall pain Negative: N/V/D, fevers  Physical Exam  BP 131/83 (BP Location: Right Arm)   Pulse 82   Temp 97.7 F (36.5 C) (Oral)   Resp 16   Ht 5\' 8"  (1.727 m)   Wt 71.7 kg   SpO2 100%   BMI 24.02 kg/m  Gen:   Awake, no distress   Resp:  Normal effort  MSK:   Moves extremities without difficulty  Other:  Minimal TTP to the left lower chest wall associated with lower lateral ribs. No palpable masses or overlying skin changes.  Medical Decision Making  Medically screening exam initiated at 1:39 AM.  Appropriate orders placed.  Ziyad Dyar was informed that the remainder of the evaluation will be completed by another provider, this initial triage assessment does not replace that evaluation, and the importance of remaining in the ED until their evaluation is complete.  Chest wall pain - negative rib series yesterday   Laurene Footman, PA-C 02/24/21 0141    02/26/21, MD 02/24/21 [phone]

## 2021-02-24 NOTE — ED Triage Notes (Signed)
Pt c/o abdominal pain x 3 days. States that he feels a palpable mass to the LUQ. Tenderness on palpation. Denies NVD.

## 2021-03-08 ENCOUNTER — Other Ambulatory Visit: Payer: Self-pay

## 2021-03-08 ENCOUNTER — Emergency Department (HOSPITAL_COMMUNITY)
Admission: EM | Admit: 2021-03-08 | Discharge: 2021-03-09 | Disposition: A | Discharge status: Home / Self Care | Payer: Self-pay | Attending: Emergency Medicine | Admitting: Emergency Medicine

## 2021-03-08 DIAGNOSIS — X58XXXA Exposure to other specified factors, initial encounter: Secondary | ICD-10-CM | POA: Insufficient documentation

## 2021-03-08 DIAGNOSIS — Z5321 Procedure and treatment not carried out due to patient leaving prior to being seen by health care provider: Secondary | ICD-10-CM | POA: Insufficient documentation

## 2021-03-08 DIAGNOSIS — S00522A Blister (nonthermal) of oral cavity, initial encounter: Secondary | ICD-10-CM | POA: Insufficient documentation

## 2021-03-08 NOTE — ED Triage Notes (Signed)
Pt reports blister on R side of tongue x 2-3 days which is causing him pain.

## 2021-03-09 NOTE — ED Notes (Signed)
Pt called multiple times no answer 

## 2021-04-17 ENCOUNTER — Emergency Department (HOSPITAL_COMMUNITY): Payer: Self-pay

## 2021-04-17 ENCOUNTER — Emergency Department (HOSPITAL_COMMUNITY)
Admission: EM | Admit: 2021-04-17 | Discharge: 2021-04-17 | Disposition: A | Discharge status: Home / Self Care | Payer: Self-pay | Attending: Emergency Medicine | Admitting: Emergency Medicine

## 2021-04-17 ENCOUNTER — Encounter (HOSPITAL_COMMUNITY): Payer: Self-pay

## 2021-04-17 ENCOUNTER — Other Ambulatory Visit: Payer: Self-pay

## 2021-04-17 DIAGNOSIS — R0602 Shortness of breath: Secondary | ICD-10-CM | POA: Insufficient documentation

## 2021-04-17 DIAGNOSIS — Z20822 Contact with and (suspected) exposure to covid-19: Secondary | ICD-10-CM | POA: Insufficient documentation

## 2021-04-17 DIAGNOSIS — R059 Cough, unspecified: Secondary | ICD-10-CM | POA: Insufficient documentation

## 2021-04-17 DIAGNOSIS — R0789 Other chest pain: Secondary | ICD-10-CM | POA: Insufficient documentation

## 2021-04-17 DIAGNOSIS — R072 Precordial pain: Secondary | ICD-10-CM

## 2021-04-17 DIAGNOSIS — Z7901 Long term (current) use of anticoagulants: Secondary | ICD-10-CM | POA: Insufficient documentation

## 2021-04-17 DIAGNOSIS — I10 Essential (primary) hypertension: Secondary | ICD-10-CM | POA: Insufficient documentation

## 2021-04-17 LAB — CBC
HCT: 40.2 % (ref 39.0–52.0)
Hemoglobin: 12.7 g/dL — ABNORMAL LOW (ref 13.0–17.0)
MCH: 26.2 pg (ref 26.0–34.0)
MCHC: 31.6 g/dL (ref 30.0–36.0)
MCV: 82.9 fL (ref 80.0–100.0)
Platelets: 322 10*3/uL (ref 150–400)
RBC: 4.85 MIL/uL (ref 4.22–5.81)
RDW: 14.5 % (ref 11.5–15.5)
WBC: 4.9 10*3/uL (ref 4.0–10.5)
nRBC: 0 % (ref 0.0–0.2)

## 2021-04-17 LAB — BASIC METABOLIC PANEL
Anion gap: 5 (ref 5–15)
BUN: 10 mg/dL (ref 6–20)
CO2: 31 mmol/L (ref 22–32)
Calcium: 9.1 mg/dL (ref 8.9–10.3)
Chloride: 101 mmol/L (ref 98–111)
Creatinine, Ser: 0.88 mg/dL (ref 0.61–1.24)
GFR, Estimated: >60 mL/min (ref >60)
Glucose, Bld: 113 mg/dL — ABNORMAL HIGH (ref 70–99)
Potassium: 3.9 mmol/L (ref 3.5–5.1)
Sodium: 137 mmol/L (ref 135–145)

## 2021-04-17 LAB — RESP PANEL BY RT-PCR (FLU A&B, COVID) ARPGX2
Influenza A by PCR: NEGATIVE
Influenza B by PCR: NEGATIVE
SARS Coronavirus 2 by RT PCR: NEGATIVE

## 2021-04-17 LAB — TROPONIN I (HIGH SENSITIVITY): Troponin I (High Sensitivity): 10 ng/L (ref <18)

## 2021-04-17 MED ORDER — ACETAMINOPHEN 500 MG PO TABS
1000.0000 mg | ORAL_TABLET | Freq: Once | ORAL | Status: AC
  Administered 2021-04-17: 1000 mg via ORAL
  Filled 2021-04-17: qty 2

## 2021-04-17 MED ORDER — ALBUTEROL SULFATE HFA 108 (90 BASE) MCG/ACT IN AERS: 2.0000 | INHALATION_SPRAY | RESPIRATORY_TRACT | Status: DC | PRN

## 2021-04-17 MED ORDER — IBUPROFEN 400 MG PO TABS
400.0000 mg | ORAL_TABLET | Freq: Once | ORAL | Status: AC
  Administered 2021-04-17: 400 mg via ORAL
  Filled 2021-04-17: qty 1

## 2021-04-17 NOTE — ED Provider Notes (Signed)
[facility] EMERGENCY DEPARTMENT Provider Note   CSN: [id number] Arrival date & time: 04/17/21  1742     History Chief Complaint  Patient presents with   'knot in chest'   Chest Pain    Edward Barr is a 50 y.o. male.  Patient indicates felt a 'knot' left lower ribs, and sharp pain to area, worse w certain movements/position changes. Occasional non prod cough. No sore throat. No known covid or flu exposure. Symptoms acute onset, early this AM, at rest, constant, non radiating, not pleuritic. No sob, nv or diaphoresis. Denies any exertional cp or discomfort. No unusual doe or fatigue. Denies leg pain or swelling. No orthopnea or pnd. Denies chest wall injury or strain. Denies recent surgery, trauma, travel, immobility. No hemoptysis. Denies chest all injury or strain. Hx dvt '2-3 yrs ago'. States non on anticoag therapy for many months.   The history is provided by the patient and medical records.  Shortness of Breath Associated symptoms: chest pain and cough   Associated symptoms: no abdominal pain, no fever, no headaches, no neck pain, no rash, no sore throat and no vomiting       Past Medical History:  Diagnosis Date   DVT (deep venous thrombosis) (HCC) 2020   Hypertension     There are no problems to display for this patient.   History reviewed. No pertinent surgical history.     Family History  Problem Relation Age of Onset   Diabetes Mother    Heart Problems Mother    Heart Problems Father     Social History   Tobacco Use   Smoking status: Never   Smokeless tobacco: Never  Substance Use Topics   Alcohol use: No   Drug use: No    Home Medications Prior to Admission medications   Medication Sig Start Date End Date Taking? Authorizing Provider  acetaminophen (TYLENOL) 500 MG tablet Take 1 tablet (500 mg total) by mouth every 6 (six) hours as needed. 11/21/18   Dahlia Byes A, NP  ibuprofen (ADVIL) 600 MG tablet Take 1 tablet (600 mg total)  by mouth every 6 (six) hours as needed. 02/23/21   Horton, Mayer Masker, MD  Rivaroxaban 15 & 20 MG TBPK Take as directed on package: Start with one 15mg  tablet by mouth twice a day with food. On Day 22, switch to one 20mg  tablet once a day with food. 07/30/18   , MD    Allergies    Pork-derived products  Review of Systems   Review of Systems  Constitutional:  Negative for fever.  HENT:  Negative for sore throat.   Eyes:  Negative for redness.  Respiratory:  Positive for cough.   Cardiovascular:  Positive for chest pain. Negative for palpitations and leg swelling.  Gastrointestinal:  Negative for abdominal pain, nausea and vomiting.  Genitourinary:  Negative for flank pain.  Musculoskeletal:  Negative for back pain and neck pain.  Skin:  Negative for rash.  Neurological:  Negative for headaches.  Hematological:  Does not bruise/bleed easily.  Psychiatric/Behavioral:  Negative for confusion.    Physical Exam Updated Vital Signs BP 114/69 (BP Location: Right Arm)    Pulse 83    Temp 98.4 F (36.9 C) (Oral)    Resp 18    Ht 1.727 m (5\' 8" )    Wt 68 kg    SpO2 98%    BMI 22.81 kg/m   Physical Exam Vitals and nursing note reviewed.  Constitutional:  Appearance: Normal appearance. He is well-developed.  HENT:     Head: Atraumatic.     Nose: Nose normal.     Mouth/Throat:     Mouth: Mucous membranes are moist.     Pharynx: Oropharynx is clear.  Eyes:     General: No scleral icterus.    Conjunctiva/sclera: Conjunctivae normal.  Neck:     Trachea: No tracheal deviation.  Cardiovascular:     Rate and Rhythm: Normal rate and regular rhythm.     Pulses: Normal pulses.     Heart sounds: Normal heart sounds. No murmur heard.   No friction rub. No gallop.  Pulmonary:     Effort: Pulmonary effort is normal. No accessory muscle usage or respiratory distress.     Breath sounds: Normal breath sounds.     Comments: Left chest wall tenderness reproducing symptoms. No  crepitus. Normal chest movement. No chest mass felt.  Abdominal:     General: Bowel sounds are normal. There is no distension.     Palpations: Abdomen is soft.     Tenderness: There is no abdominal tenderness.  Genitourinary:    Comments: No cva tenderness. Musculoskeletal:        General: No swelling or tenderness.     Cervical back: Normal range of motion and neck supple. No rigidity.     Right lower leg: No edema.     Left lower leg: No edema.  Skin:    General: Skin is warm and dry.     Findings: No rash.  Neurological:     Mental Status: He is alert.     Comments: Alert, speech clear.   Psychiatric:        Mood and Affect: Mood normal.    ED Results / Procedures / Treatments   Labs (all labs ordered are listed, but only abnormal results are displayed) Results for orders placed or performed during the hospital encounter of 04/17/21  Basic metabolic panel  Result Value Ref Range   Sodium 137 135 - 145 mmol/L   Potassium 3.9 3.5 - 5.1 mmol/L   Chloride 101 98 - 111 mmol/L   CO2 31 22 - 32 mmol/L   Glucose, Bld 113 (H) 70 - 99 mg/dL   BUN 10 6 - 20 mg/dL   Creatinine, Ser 5.02 0.61 - 1.24 mg/dL   Calcium 9.1 8.9 - 77.4 mg/dL   GFR, Estimated >12 >87 mL/min   Anion gap 5 5 - 15  CBC  Result Value Ref Range   WBC 4.9 4.0 - 10.5 K/uL   RBC 4.85 4.22 - 5.81 MIL/uL   Hemoglobin 12.7 (L) 13.0 - 17.0 g/dL   HCT 86.7 67.2 - 09.4 %   MCV 82.9 80.0 - 100.0 fL   MCH 26.2 26.0 - 34.0 pg   MCHC 31.6 30.0 - 36.0 g/dL   RDW 70.9 62.8 - 36.6 %   Platelets 322 150 - 400 K/uL   nRBC 0.0 0.0 - 0.2 %  Troponin I (High Sensitivity)  Result Value Ref Range   Troponin I (High Sensitivity) 10 <18 ng/L   DG Chest 2 View  Result Date: 04/17/2021 CLINICAL DATA:  Shortness of breath and a knot underneath the left rib cage. EXAM: CHEST - 2 VIEW COMPARISON:  Chest radiograph 02/23/2021 FINDINGS: Low volume study. The cardiomediastinal contours are within normal limits. The lungs are  clear. No pneumothorax or pleural effusion. No acute finding in the visualized skeleton. IMPRESSION: No acute cardiopulmonary process. Electronically Signed  By: Emmaline Kluver M.D.   On: 04/17/2021 18:59    EKG EKG Interpretation  Date/Time:  Saturday April 17 2021 18:23:47 EST Ventricular Rate:  77 PR Interval:  194 QRS Duration: 80 QT Interval:  360 QTC Calculation: 407 R Axis:   103 Text Interpretation: Normal sinus rhythm Rightward axis Nonspecific T wave abnormality No significant change since last tracing Confirmed by Cathren Laine ([id number]) on 04/17/2021 7:12:48 PM  Radiology DG Chest 2 View  Result Date: 04/17/2021 CLINICAL DATA:  Shortness of breath and a knot underneath the left rib cage. EXAM: CHEST - 2 VIEW COMPARISON:  Chest radiograph 02/23/2021 FINDINGS: Low volume study. The cardiomediastinal contours are within normal limits. The lungs are clear. No pneumothorax or pleural effusion. No acute finding in the visualized skeleton. IMPRESSION: No acute cardiopulmonary process. Electronically Signed   By: Emmaline Kluver M.D.   On: 04/17/2021 18:59    Procedures Procedures   Medications Ordered in ED Medications  albuterol (VENTOLIN HFA) 108 (90 Base) MCG/ACT inhaler 2 puff (has no administration in time range)  ibuprofen (ADVIL) tablet 400 mg (has no administration in time range)  acetaminophen (TYLENOL) tablet 1,000 mg (has no administration in time range)    ED Course  I have reviewed the triage vital signs and the nursing notes.  Pertinent labs & imaging results that were available during my care of the patient were reviewed by me and considered in my medical decision making (see chart for details).    MDM Rules/Calculators/A&P                         Labs sent. Cxr. Ecg.   Reviewed nursing notes and prior charts for additional history. Prior ED visit 01/2021 noted, pt w concern pain/knot to chest them - workup then neg.   Labs reviewed/interpreted by  me - trop normal (after symptoms present, constant x ~ 12 hrs, felt not c/w acs).   CXR reviewed/interpreted by me - no ptx or pna.   Acetaminophen po, ibuprofen po.   Recheck pt, hr 68, rr 16, pulse ox 100% on room air, no increased wob, no distress.  Pt with reproducible chest wall pain, reproducing his symptoms - felt most c/w chest wall pain.   Pt currently appears stable for d/c.   Rec outpt pcp/cardiology f/u.  Return precautions provided.      Final Clinical Impression(s) / ED Diagnoses Final diagnoses:  None    Rx / DC Orders ED Discharge Orders     None        Cathren Laine, MD 04/17/21 2115

## 2021-04-17 NOTE — ED Provider Notes (Signed)
Emergency Medicine Provider Triage Evaluation Note  Edward Barr , a 50 y.o. male  was evaluated in triage.  Pt complains of shortness of breath,  Pt reports he has a painful knot on his left chest   Review of Systems  Positive: Short of breath Negative: fever  Physical Exam  BP 114/69 (BP Location: Right Arm)    Pulse 83    Temp 98.4 F (36.9 C) (Oral)    Resp 18    Ht 5\' 8"  (1.727 m)    Wt 68 kg    SpO2 98%    BMI 22.81 kg/m  Gen:   Awake, no distress   Resp:  Normal effort  MSK:   Moves extremities without difficulty  Other:    Medical Decision Making  Medically screening exam initiated at 6:08 PM.  Appropriate orders placed.  Edward Barr was informed that the remainder of the evaluation will be completed by another provider, this initial triage assessment does not replace that evaluation, and the importance of remaining in the ED until their evaluation is complete.     Laurene Footman, Elson Areas 04/17/21 1813    04/19/21, MD 04/21/21 1324

## 2021-04-17 NOTE — ED Triage Notes (Signed)
Pt arrived POV fromhome c/o SHOB and a knot on the left side under his rib cage. Pt states the [facility] started 2 hrs ago and then he noticed the knot.

## 2021-04-17 NOTE — ED Notes (Signed)
Radiology to transport pt to treatment room xray.

## 2021-04-17 NOTE — Discharge Instructions (Addendum)
It was our pleasure to provide your ER care today - we hope that you feel better.  Overall your lab tests and xray look good/normal.   Take your meds/blood thinner medication as prescribed.   Take acetaminophen as need for pain.   Follow up with primary care doctor in the next 1-2 weeks.   Return to ER if worse, new symptoms, high fevers, recurrent/persistent chest pain, increased trouble breathing, or other concern.

## 2021-08-28 ENCOUNTER — Other Ambulatory Visit: Payer: Self-pay

## 2021-08-28 ENCOUNTER — Emergency Department (HOSPITAL_COMMUNITY)
Admission: EM | Admit: 2021-08-28 | Discharge: 2021-08-28 | Disposition: A | Discharge status: Home / Self Care | Payer: Self-pay | Attending: Emergency Medicine | Admitting: Emergency Medicine

## 2021-08-28 ENCOUNTER — Encounter (HOSPITAL_COMMUNITY): Payer: Self-pay | Admitting: *Deleted

## 2021-08-28 DIAGNOSIS — X58XXXA Exposure to other specified factors, initial encounter: Secondary | ICD-10-CM | POA: Insufficient documentation

## 2021-08-28 DIAGNOSIS — T171XXA Foreign body in nostril, initial encounter: Secondary | ICD-10-CM | POA: Insufficient documentation

## 2021-08-28 MED ORDER — CEPHALEXIN 500 MG PO CAPS: 500.0000 mg | ORAL_CAPSULE | Freq: Four times a day (QID) | ORAL | 0 refills | Status: DC

## 2021-08-28 NOTE — ED Triage Notes (Signed)
Pt rolled tissue to clear nose, tissue stuck in left nare. Resulting in left eye pressure ?

## 2021-08-28 NOTE — ED Provider Notes (Addendum)
?[facility] DEPT ?Provider Note ? ? ?CSN: [id number] ?Arrival date & time: 08/28/21  1523 ? ?  ? ?History ? ?Chief Complaint  ?Patient presents with  ? Foreign Body in [place]  ? ? ?Edward Barr is a 51 y.o. male. ? ?HPI ? ?  ? ?51 year old male comes in with chief complaint of tissue stuck in his nose ? ?Patient has had some URI.  Earlier today, he accidentally stuck tissue inside his left nare, that he is unable to pull out. ? ?Patient is having discomfort over his left because of it. He had minor bleeding earlier. ? ?Home Medications ?Prior to Admission medications   ?Medication Sig Start Date End Date Taking? Authorizing Provider  ?cephALEXin (KEFLEX) 500 MG capsule Take 1 capsule (500 mg total) by mouth 4 (four) times daily. 08/28/21  Yes Varney Biles, MD  ?acetaminophen (TYLENOL) 500 MG tablet Take 1 tablet (500 mg total) by mouth every 6 (six) hours as needed. 11/21/18   Loura Halt A, NP  ?ibuprofen (ADVIL) 600 MG tablet Take 1 tablet (600 mg total) by mouth every 6 (six) hours as needed. 02/23/21   Horton, Barbette Hair, MD  ?Rivaroxaban 15 & 20 MG TBPK Take as directed on package: Start with one 15mg  tablet by mouth twice a day with food. On Day 22, switch to one 20mg  tablet once a day with food. 07/30/18   Duffy Bruce, MD  ?   ? ?Allergies    ?Pork-derived products   ? ?Review of Systems   ?Review of Systems ? ?Physical Exam ?Updated Vital Signs ?BP 122/82   Pulse 70   Ht 5\' 8"  (1.727 m)   Wt 71.7 kg   SpO2 97%   BMI 24.02 kg/m?  ?Physical Exam ?Vitals and nursing note reviewed.  ?Constitutional:   ?   Appearance: He is well-developed.  ?HENT:  ?   Nose:  ?   Comments: Left nare has visible foreign body deep within the turbinate ?Cardiovascular:  ?   Rate and Rhythm: Normal rate.  ?Pulmonary:  ?   Effort: Pulmonary effort is normal.  ?Musculoskeletal:  ?   Cervical back: Neck supple.  ?Skin: ?   General: Skin is warm.  ?Neurological:  ?   Mental Status: He is alert and  oriented to person, place, and time.  ? ? ?ED Results / Procedures / Treatments   ?Labs ?(all labs ordered are listed, but only abnormal results are displayed) ?Labs Reviewed - No data to display ? ?EKG ?None ? ?Radiology ?No results found. ? ?Procedures ?Marland KitchenForeign Body Removal ? ?Date/Time: 08/28/2021 5:12 PM ?Performed by: Varney Biles, MD ?Authorized by: Varney Biles, MD  ?Consent: Verbal consent obtained. ?Risks and benefits: risks, benefits and alternatives were discussed ?Consent given by: patient ?Patient understanding: patient states understanding of the procedure being performed ?Patient identity confirmed: arm band ?Body area: nose ?Location details: left nostril ?Patient cooperative: yes ?Localization method: nasal speculum, probed and visualized ?Removal mechanism: suction, forceps and curette ?Complexity: complex ?1 objects recovered. ?Objects recovered: Large tissue tampon ?Post-procedure assessment: foreign body removed ?Patient tolerance: patient tolerated the procedure well with no immediate complications ?Comments: Initially, we had small pieces of the tissue that were recovered using the curette and then forcep.  Finally, large piece of tissue was removed.  No residual foreign body appreciated. ?  ? ? ?Medications Ordered in ED ?Medications - No data to display ? ?ED Course/ Medical Decision Making/ A&P ?  ?                        ?  Medical Decision Making ?Problems Addressed: ?Foreign body in nose, initial encounter: self-limited or minor problem ? ?Risk ?Prescription drug management. ? ? ?51 year old male comes in with chief complaint of foreign body to the nare. ?L sided foreign body visualized, it was removed with curette and forcep. ? ?No complication.  ? ?We will put him on Keflex for 3 days prophylactically given that he mentions some bleeding and eye pressure.  He is not toxic appearing and immunocompetent, which is reassuring. ? ?Return precautions for infection discussed. ? ?Final  Clinical Impression(s) / ED Diagnoses ?Final diagnoses:  ?Foreign body in nose, initial encounter  ? ? ?Rx / DC Orders ?ED Discharge Orders   ? ?      Ordered  ?  cephALEXin (KEFLEX) 500 MG capsule  4 times daily       ? 08/28/21 1617  ? ?  ?  ? ?  ? ? ? ? ?  ?Varney Biles, MD ?08/28/21 1722 ? ?

## 2021-08-28 NOTE — Discharge Instructions (Addendum)
Prevent blowing hard through the affected nare for 24 hours to prevent bleeding. ?3 days of antibiotics ordered to prevent infection. ?Return to the ER if you start having severe headaches, fevers, chills. ?

## 2022-06-27 IMAGING — CR DG ELBOW COMPLETE 3+V*L*
4 series · 4 of 4 positions shown · non-contrast
Comparison: None.

CLINICAL DATA: Left elbow pain.

EXAM:
LEFT ELBOW - COMPLETE 3+ VIEW

[elbow ap]
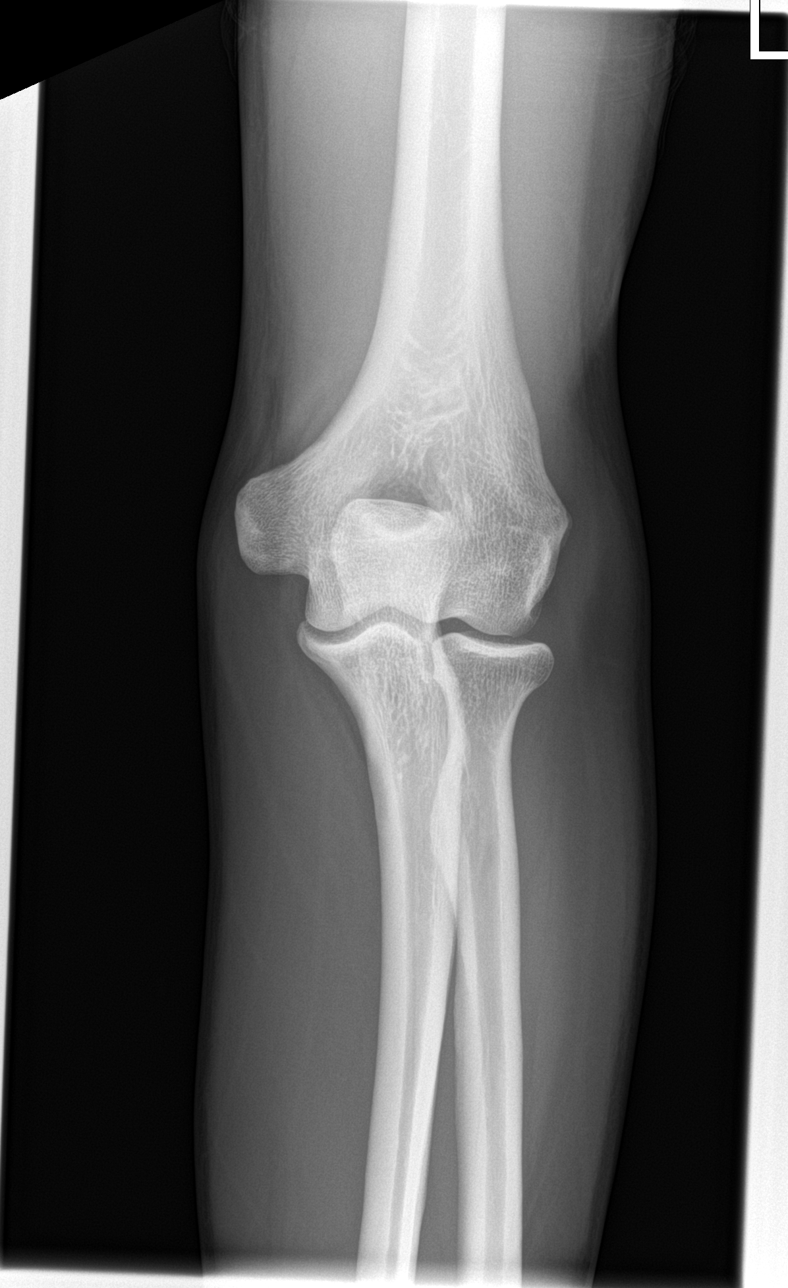

[elbow obl (1 of 2)]
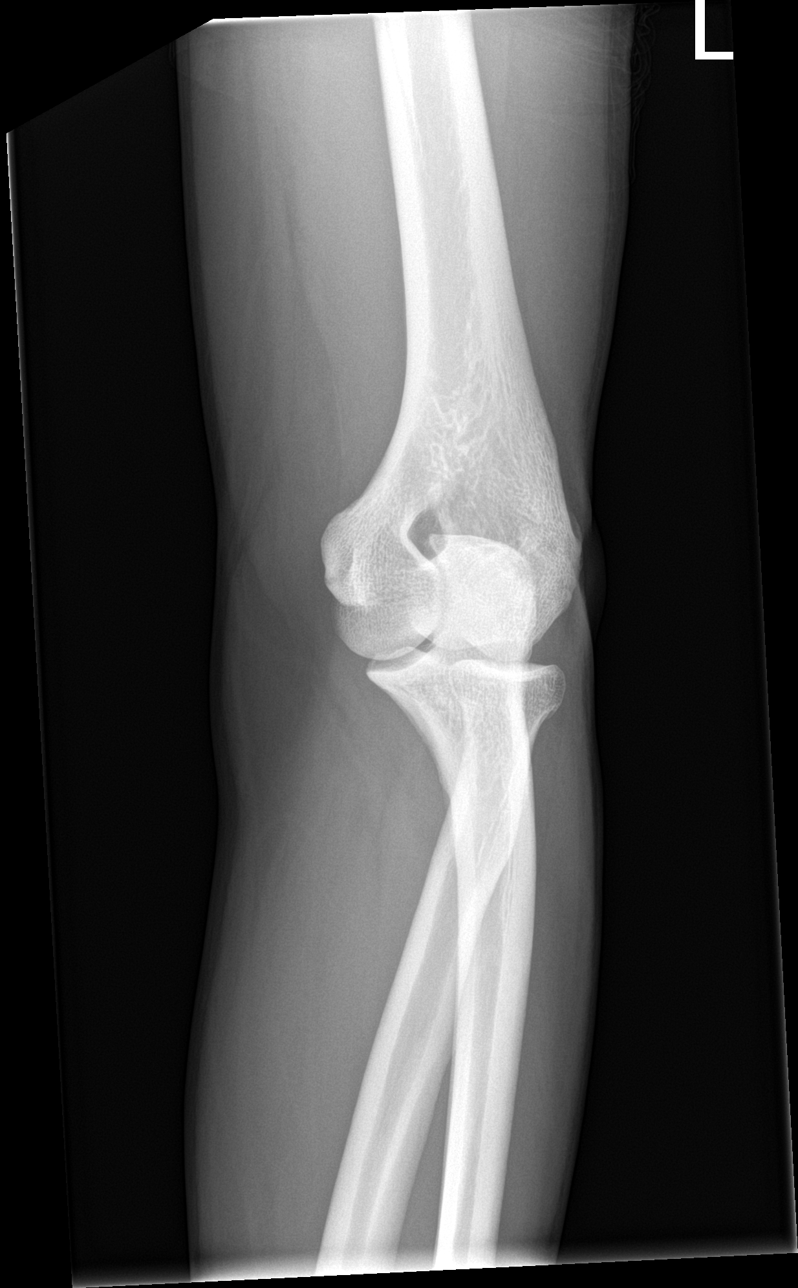

[elbow obl (2 of 2)]
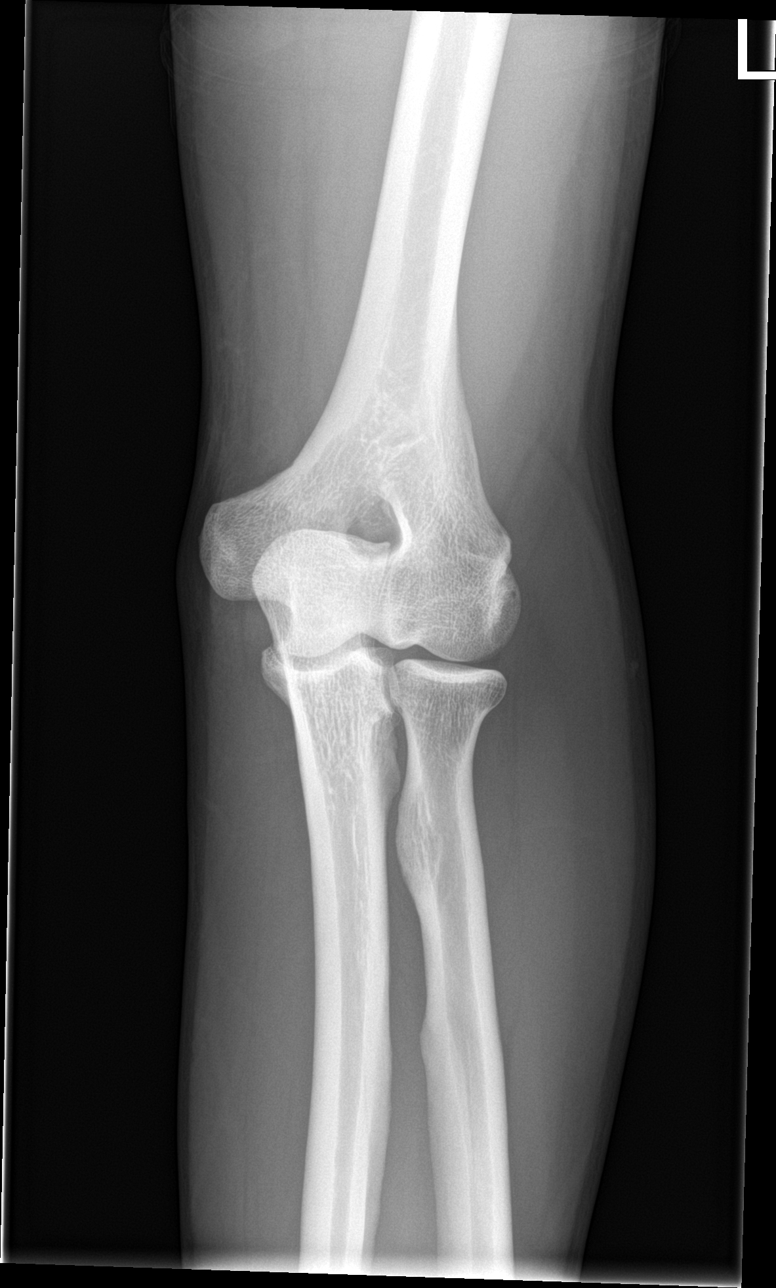

[elbow lat]
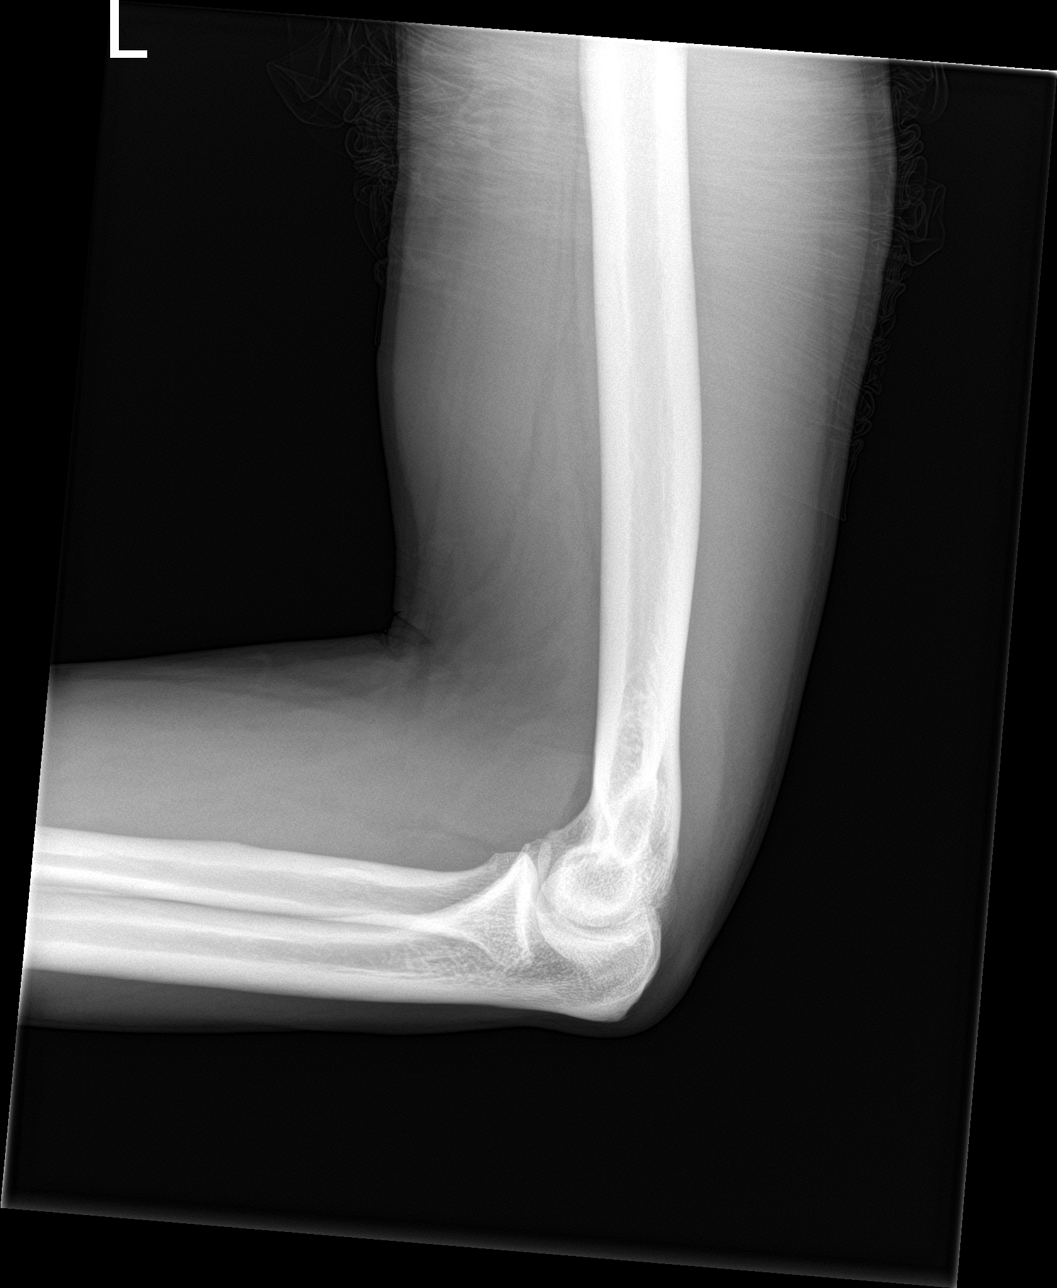

[4 of 4 positions shown; findings below may reference images not displayed]

FINDINGS: There is no evidence of fracture, dislocation, or joint effusion.
There is no evidence of arthropathy or other focal bone abnormality.
Soft tissues are unremarkable.
IMPRESSION: Negative.

## 2022-10-20 ENCOUNTER — Other Ambulatory Visit (HOSPITAL_COMMUNITY)
Admission: EM | Admit: 2022-10-20 | Discharge: 2022-10-24 | Disposition: A | Discharge status: Home / Self Care | Payer: No Payment, Other | Source: Home / Self Care | Admitting: Behavioral Health

## 2022-10-20 ENCOUNTER — Ambulatory Visit (HOSPITAL_COMMUNITY)
Admission: EM | Admit: 2022-10-20 | Discharge: 2022-10-20 | Disposition: A | Discharge status: Home / Self Care | Payer: No Payment, Other | Attending: Behavioral Health | Admitting: Behavioral Health

## 2022-10-20 DIAGNOSIS — F1990 Other psychoactive substance use, unspecified, uncomplicated: Secondary | ICD-10-CM | POA: Diagnosis present

## 2022-10-20 DIAGNOSIS — F1910 Other psychoactive substance abuse, uncomplicated: Secondary | ICD-10-CM | POA: Diagnosis not present

## 2022-10-20 DIAGNOSIS — F1113 Opioid abuse with withdrawal: Secondary | ICD-10-CM | POA: Insufficient documentation

## 2022-10-20 DIAGNOSIS — F1190 Opioid use, unspecified, uncomplicated: Secondary | ICD-10-CM | POA: Diagnosis present

## 2022-10-20 DIAGNOSIS — F4310 Post-traumatic stress disorder, unspecified: Secondary | ICD-10-CM | POA: Diagnosis present

## 2022-10-20 DIAGNOSIS — R109 Unspecified abdominal pain: Secondary | ICD-10-CM | POA: Diagnosis present

## 2022-10-20 DIAGNOSIS — F1994 Other psychoactive substance use, unspecified with psychoactive substance-induced mood disorder: Secondary | ICD-10-CM | POA: Diagnosis present

## 2022-10-20 DIAGNOSIS — F32A Depression, unspecified: Secondary | ICD-10-CM | POA: Diagnosis not present

## 2022-10-20 DIAGNOSIS — F1410 Cocaine abuse, uncomplicated: Secondary | ICD-10-CM | POA: Insufficient documentation

## 2022-10-20 DIAGNOSIS — Z5901 Sheltered homelessness: Secondary | ICD-10-CM | POA: Diagnosis not present

## 2022-10-20 LAB — CBC WITH DIFFERENTIAL/PLATELET
Abs Immature Granulocytes: 0.02 10*3/uL (ref 0.00–0.07)
Basophils Absolute: 0 10*3/uL (ref 0.0–0.1)
Basophils Relative: 0 %
Eosinophils Absolute: 0.1 10*3/uL (ref 0.0–0.5)
Eosinophils Relative: 2 %
HCT: 40.3 % (ref 39.0–52.0)
Hemoglobin: 13 g/dL (ref 13.0–17.0)
Immature Granulocytes: 0 %
Lymphocytes Relative: 31 %
Lymphs Abs: 1.7 10*3/uL (ref 0.7–4.0)
MCH: 26.1 pg (ref 26.0–34.0)
MCHC: 32.3 g/dL (ref 30.0–36.0)
MCV: 80.9 fL (ref 80.0–100.0)
Monocytes Absolute: 0.3 10*3/uL (ref 0.1–1.0)
Monocytes Relative: 5 %
Neutro Abs: 3.4 10*3/uL (ref 1.7–7.7)
Neutrophils Relative %: 62 %
Platelets: 348 10*3/uL (ref 150–400)
RBC: 4.98 MIL/uL (ref 4.22–5.81)
RDW: 12.9 % (ref 11.5–15.5)
WBC: 5.6 10*3/uL (ref 4.0–10.5)
nRBC: 0 % (ref 0.0–0.2)

## 2022-10-20 LAB — COMPREHENSIVE METABOLIC PANEL
ALT: 19 U/L (ref 0–44)
AST: 20 U/L (ref 15–41)
Albumin: 4 g/dL (ref 3.5–5.0)
Alkaline Phosphatase: 68 U/L (ref 38–126)
Anion gap: 9 (ref 5–15)
BUN: 8 mg/dL (ref 6–20)
CO2: 26 mmol/L (ref 22–32)
Calcium: 9.1 mg/dL (ref 8.9–10.3)
Chloride: 99 mmol/L (ref 98–111)
Creatinine, Ser: 1.04 mg/dL (ref 0.61–1.24)
GFR, Estimated: >60 mL/min (ref >60)
Glucose, Bld: 104 mg/dL — ABNORMAL HIGH (ref 70–99)
Potassium: 3.8 mmol/L (ref 3.5–5.1)
Sodium: 134 mmol/L — ABNORMAL LOW (ref 135–145)
Total Bilirubin: 1.1 mg/dL (ref 0.3–1.2)
Total Protein: 7.5 g/dL (ref 6.5–8.1)

## 2022-10-20 LAB — LIPID PANEL
Cholesterol: 217 mg/dL — ABNORMAL HIGH (ref 0–200)
HDL: 67 mg/dL (ref >40)
LDL Cholesterol: 140 mg/dL — ABNORMAL HIGH (ref 0–99)
Total CHOL/HDL Ratio: 3.2 RATIO
Triglycerides: 50 mg/dL (ref <150)
VLDL: 10 mg/dL (ref 0–40)

## 2022-10-20 LAB — ETHANOL: Alcohol, Ethyl (B): <10 mg/dL (ref <10)

## 2022-10-20 LAB — HEMOGLOBIN A1C
Hgb A1c MFr Bld: 5.3 % (ref 4.8–5.6)
Mean Plasma Glucose: 105.41 mg/dL

## 2022-10-20 LAB — TSH: TSH: 0.879 u[IU]/mL (ref 0.350–4.500)

## 2022-10-20 MED ORDER — ONDANSETRON 4 MG PO TBDP
4.0000 mg | ORAL_TABLET | Freq: Four times a day (QID) | ORAL | Status: DC | PRN
Start: 2022-10-20 — End: 2022-10-20

## 2022-10-20 MED ORDER — HYDROXYZINE HCL 25 MG PO TABS
25.0000 mg | ORAL_TABLET | Freq: Three times a day (TID) | ORAL | Status: DC | PRN
  Administered 2022-10-22: 25 mg via ORAL
  Filled 2022-10-20: qty 1

## 2022-10-20 MED ORDER — ACETAMINOPHEN 325 MG PO TABS: 650.0000 mg | ORAL_TABLET | Freq: Four times a day (QID) | ORAL | Status: DC | PRN

## 2022-10-20 MED ORDER — ADULT MULTIVITAMIN W/MINERALS CH
1.0000 | ORAL_TABLET | Freq: Every day | ORAL | Status: DC
  Administered 2022-10-20 – 2022-10-24 (×5): 1 via ORAL
  Filled 2022-10-20 (×6): qty 1

## 2022-10-20 MED ORDER — MAGNESIUM HYDROXIDE 400 MG/5ML PO SUSP
30.0000 mL | Freq: Every day | ORAL | Status: DC | PRN
  Administered 2022-10-22: 30 mL via ORAL
  Filled 2022-10-20: qty 30

## 2022-10-20 MED ORDER — TRAZODONE HCL 50 MG PO TABS
50.0000 mg | ORAL_TABLET | Freq: Every evening | ORAL | Status: DC | PRN
  Administered 2022-10-22: 50 mg via ORAL
  Filled 2022-10-20 (×3): qty 1

## 2022-10-20 MED ORDER — ALUM & MAG HYDROXIDE-SIMETH 200-200-20 MG/5ML PO SUSP
30.0000 mL | ORAL | Status: DC | PRN
  Administered 2022-10-23: 30 mL via ORAL
  Filled 2022-10-20: qty 30

## 2022-10-20 MED ORDER — NAPROXEN 500 MG PO TABS: 500.0000 mg | ORAL_TABLET | Freq: Two times a day (BID) | ORAL | Status: DC | PRN

## 2022-10-20 MED ORDER — THIAMINE HCL 100 MG/ML IJ SOLN
100.0000 mg | Freq: Once | INTRAMUSCULAR | Status: AC
  Administered 2022-10-20: 100 mg via INTRAMUSCULAR
  Filled 2022-10-20: qty 2

## 2022-10-20 MED ORDER — METHOCARBAMOL 500 MG PO TABS: 500.0000 mg | ORAL_TABLET | Freq: Three times a day (TID) | ORAL | Status: DC | PRN

## 2022-10-20 MED ORDER — THIAMINE MONONITRATE 100 MG PO TABS
100.0000 mg | ORAL_TABLET | Freq: Every day | ORAL | Status: DC
  Administered 2022-10-21 – 2022-10-24 (×4): 100 mg via ORAL
  Filled 2022-10-20 (×4): qty 1

## 2022-10-20 MED ORDER — LORAZEPAM 1 MG PO TABS: 1.0000 mg | ORAL_TABLET | Freq: Four times a day (QID) | ORAL | Status: AC | PRN

## 2022-10-20 MED ORDER — LOPERAMIDE HCL 2 MG PO CAPS: 2.0000 mg | ORAL_CAPSULE | ORAL | Status: DC | PRN

## 2022-10-20 MED ORDER — ONDANSETRON 4 MG PO TBDP: 4.0000 mg | ORAL_TABLET | Freq: Four times a day (QID) | ORAL | Status: AC | PRN

## 2022-10-20 MED ORDER — DICYCLOMINE HCL 20 MG PO TABS
20.0000 mg | ORAL_TABLET | Freq: Four times a day (QID) | ORAL | Status: DC | PRN
  Administered 2022-10-20: 20 mg via ORAL
  Filled 2022-10-20: qty 1

## 2022-10-20 NOTE — Discharge Instructions (Addendum)
Patient to transfer to the Facility Based Crisis Unit for substance abuse treatment

## 2022-10-20 NOTE — ED Notes (Signed)
Called report to [facility] nurse. And took patient to the unit.

## 2022-10-20 NOTE — Group Note (Signed)
Group Topic: Balance in Life  Group Date: 10/20/2022 Start Time: 0800 End Time: 0845 Facilitators: Rae Lips B  Department: [facility]  Number of Participants: 3  Group Focus: acceptance Treatment Modality:  Cognitive Behavioral Therapy Interventions utilized were leisure development Purpose: express feelings  Name: Edward Barr Date of Birth: 1971-04-20  MR: [id number]    Level of Participation: Did not attend group. Quality of Participation: NA Interactions with others: NA Mood/Affect: NA Triggers (if applicable): NA Cognition: coherent/clear Progress: NA Response: NA Plan: patient will be encouraged to  to attend groups.   Patients Problems:  Patient Active Problem List   Diagnosis Date Noted   Substance use disorder 10/20/2022

## 2022-10-20 NOTE — ED Notes (Signed)
Pt asleep at this hour. No apparent distress. RR even and unlabored. Monitored for safety.  

## 2022-10-20 NOTE — ED Provider Notes (Signed)
Facility Based Crisis Admission H&P  Date: 10/20/22 Patient Name: Edward Barr MRN: [id number] Chief Complaint:   Diagnoses:  Final diagnoses:  Substance abuse (HCC)  Cocaine abuse (HCC)  Opiate use  Substance induced mood disorder (HCC)    HPI: Edward Barr is a 52 year old male with no past psychiatric history who presents to the [facility] behavioral health urgent care voluntary unaccompanied seeking substance abuse treatment and detox.   Patient reports abusing cocaine since age 39. On average, he reports using a gram of cocaine every other day. He states that he last used cocaine last night up until this morning at 4 AM.   He reports abusing opiates, mostly Percocets for the past 3 to 4 months on average he has been consuming 2 pills (20 mg) per day. He reports last using Percocet yesterday. He reports opiate withdrawal symptoms of abdominal cramps.  He reports drinking alcohol every day, on average 2 (12 ounce) beers per day. He reports drinking alcohol since age 62. He reports last drinking alcohol yesterday, 4 (12 oz) cans of beer. He denies alcohol withdrawal symptoms. He denies a history of alcohol withdrawal, seizures, or delirium tremens.   He states that he can go days without drinking beer and does not have alcohol withdrawals. He states that his longest sobriety was 10 years ago  when he was in prison for drug trafficking from 89-2000. He denies legal issues at this time.  He denies past substance abuse treatments. He states that he is motivated for treatment at this time because he is tired of living like this, it is interfering with his relationship with his fiance and he wants to be able to stop using drugs so that he can get a job. He is currently unemployed.  He reports feeling depressed. He describes his depressive symptoms as feeling sad about his life. PHQ-9 is 10.  He denies AVH but reports past AH for the past 3 years due to drug use. There is no objective  evidence that the patient is responding to internal or external stimuli or experiencing paranoia or delusional thought content. He denies SI/HI.   He denies a past psychiatric history. He denies past suicide attempts. He denies past inpatient hospitalizations. He denies outpatient psychiatry or therapy. He reports a family psychiatric history of father has a history of alcoholism.   He states that he is currently separated from his fiance Candace [phone] due to his drug use. He states that he has been living at a motel for the past 2 weeks. He denies current medical problems. He denies taking prescribed or over-the-counter medications at this time. He states that he has 9 children ages 73, 23, 66, 58, 59, 53 108, 89, and 41. He reports having 32 grandkids.   PHQ 2-9:   Flowsheet Row ED from 10/20/2022 in [facility] Most recent reading at 10/20/2022  4:48 PM ED from 10/20/2022 in [facility] Most recent reading at 10/20/2022  4:03 PM ED from 08/28/2021 in [facility] Emergency Department at [place] Most recent reading at 08/28/2021  3:33 PM  C-SSRS RISK CATEGORY No Risk No Risk No Risk         Total Time spent with patient: 30 minutes  Musculoskeletal  Strength & Muscle Tone: within normal limits Gait & Station: normal Patient leans: N/A  Psychiatric Specialty Exam  Presentation General Appearance:  Appropriate for Environment  Eye Contact: Fair  Speech: Clear and Coherent  Speech Volume: Normal  Handedness: Right   Mood and Affect  Mood: Depressed  Affect: Congruent   Thought Process  Thought Processes: Coherent; Goal Directed  Descriptions of Associations:Intact  Orientation:Full (Time, Place and Person)  Thought Content:Logical    Hallucinations:Hallucinations: None  Ideas of Reference:None  Suicidal Thoughts:Suicidal Thoughts: No  Homicidal Thoughts:Homicidal Thoughts:  No   Sensorium  Memory: Immediate Fair; Recent Fair; Remote Fair  Judgment: Intact  Insight: Present   Executive Functions  Concentration: Fair  Attention Span: Fair  Recall: [employer] of Knowledge: Fair  Language: Fair   Psychomotor Activity  Psychomotor Activity: Psychomotor Activity: Normal   Assets  Assets: Communication Skills; Desire for Improvement; Leisure Time; Physical Health   Sleep  Sleep: Sleep: Poor Number of Hours of Sleep: 4   Nutritional Assessment (For OBS and [facility]BC admissions only) Has the patient had a weight loss or gain of 10 pounds or more in the last 3 months?: No Has the patient had a decrease in food intake/or appetite?: No Does the patient have dental problems?: No Does the patient have eating habits or behaviors that may be indicators of an eating disorder including binging or inducing vomiting?: No Has the patient recently lost weight without trying?: 0 Has the patient been eating poorly because of a decreased appetite?: 1 Malnutrition Screening Tool Score: 1    Physical Exam HENT:     Head: Normocephalic.     Nose: Nose normal.  Eyes:     Conjunctiva/sclera: Conjunctivae normal.  Cardiovascular:     Rate and Rhythm: Normal rate.  Pulmonary:     Effort: Pulmonary effort is normal.  Musculoskeletal:        General: Normal range of motion.     Cervical back: Normal range of motion.  Neurological:     Mental Status: He is alert and oriented to person, place, and time.    Review of Systems  Constitutional: Negative.   HENT: Negative.    Eyes: Negative.   Respiratory: Negative.    Cardiovascular: Negative.   Gastrointestinal:  Positive for abdominal pain.  Genitourinary: Negative.   Musculoskeletal: Negative.   Neurological: Negative.   Endo/Heme/Allergies: Negative.   Psychiatric/Behavioral:  Positive for substance abuse.     Blood pressure 113/64, resp. rate 17, SpO2 99 %. There is no height or weight on  file to calculate BMI.  Past Psychiatric History: No past psychiatric history.   Is the patient at risk to self? No  Has the patient been a risk to self in the past 6 months? No .    Has the patient been a risk to self within the distant past? No   Is the patient a risk to others? No   Has the patient been a risk to others in the past 6 months? No   Has the patient been a risk to others within the distant past? No   Past Medical History: None.   Family History: Father history of alcoholism.   Social History: He states that he is currently separated from his fiance Candace [phone] due to his drug use. He states that he has been living at a motel for the past 2 weeks. He denies current medical problems. He denies taking prescribed or over-the-counter medications at this time. He states that he has 9 children ages 18, 47, 76, 2, 25, 74 5, 40, and 24. He reports having 32 grandkids.  Last Labs:  Admission on 10/20/2022  Component Date Value Ref Range  Status   WBC 10/20/2022 5.6  4.0 - 10.5 K/uL Final   RBC 10/20/2022 4.98  4.22 - 5.81 MIL/uL Final   Hemoglobin 10/20/2022 13.0  13.0 - 17.0 g/dL Final   HCT 84/69/6295 40.3  39.0 - 52.0 % Final   MCV 10/20/2022 80.9  80.0 - 100.0 fL Final   MCH 10/20/2022 26.1  26.0 - 34.0 pg Final   MCHC 10/20/2022 32.3  30.0 - 36.0 g/dL Final   RDW 28/41/3244 12.9  11.5 - 15.5 % Final   Platelets 10/20/2022 348  150 - 400 K/uL Final   nRBC 10/20/2022 0.0  0.0 - 0.2 % Final   Neutrophils Relative % 10/20/2022 62  % Final   Neutro Abs 10/20/2022 3.4  1.7 - 7.7 K/uL Final   Lymphocytes Relative 10/20/2022 31  % Final   Lymphs Abs 10/20/2022 1.7  0.7 - 4.0 K/uL Final   Monocytes Relative 10/20/2022 5  % Final   Monocytes Absolute 10/20/2022 0.3  0.1 - 1.0 K/uL Final   Eosinophils Relative 10/20/2022 2  % Final   Eosinophils Absolute 10/20/2022 0.1  0.0 - 0.5 K/uL Final   Basophils Relative 10/20/2022 0  % Final   Basophils Absolute 10/20/2022  0.0  0.0 - 0.1 K/uL Final   Immature Granulocytes 10/20/2022 0  % Final   Abs Immature Granulocytes 10/20/2022 0.02  0.00 - 0.07 K/uL Final   Performed at [facility], [address], [place], [place] [address]   Sodium 10/20/2022 134 (L)  135 - 145 mmol/L Final   Potassium 10/20/2022 3.8  3.5 - 5.1 mmol/L Final   Chloride 10/20/2022 99  98 - 111 mmol/L Final   CO2 10/20/2022 26  22 - 32 mmol/L Final   Glucose, Bld 10/20/2022 104 (H)  70 - 99 mg/dL Final   Glucose reference range applies only to samples taken after fasting for at least 8 hours.   BUN 10/20/2022 8  6 - 20 mg/dL Final   Creatinine, Ser 10/20/2022 1.04  0.61 - 1.24 mg/dL Final   Calcium 25/36/6440 9.1  8.9 - 10.3 mg/dL Final   Total Protein 34/74/2595 7.5  6.5 - 8.1 g/dL Final   Albumin 63/87/5643 4.0  3.5 - 5.0 g/dL Final   AST 32/95/1884 20  15 - 41 U/L Final   ALT 10/20/2022 19  0 - 44 U/L Final   Alkaline Phosphatase 10/20/2022 68  38 - 126 U/L Final   Total Bilirubin 10/20/2022 1.1  0.3 - 1.2 mg/dL Final   GFR, Estimated 10/20/2022 >60  >60 mL/min Final   Comment: (NOTE) Calculated using the CKD-EPI Creatinine Equation (2021)    Anion gap 10/20/2022 9  5 - 15 Final   Performed at [facility], [address], [place], [place] [address]   Hgb A1c MFr Bld 10/20/2022 5.3  4.8 - 5.6 % Final   Comment: (NOTE) Pre diabetes:          5.7%-6.4%  Diabetes:              >6.4%  Glycemic control for   <7.0% adults with diabetes    Mean Plasma Glucose 10/20/2022 105.41  mg/dL Final   Performed at [facility], [address], [place], [place] [address]   Alcohol, Ethyl (B) 10/20/2022 <10  <10 mg/dL Final   Comment: (NOTE) Lowest detectable limit for serum alcohol is 10 mg/dL.  For medical purposes only. Performed at [facility], [address] [place], [place] [address]    Cholesterol 10/20/2022 217 (H)  0 - 200 mg/dL Final   Triglycerides 62/13/0865 50  <150 mg/dL Final   HDL 78/46/9629  67  >40 mg/dL Final   Total CHOL/HDL Ratio 10/20/2022 3.2  RATIO Final   VLDL 10/20/2022 10  0 - 40 mg/dL Final   LDL Cholesterol 10/20/2022 140 (H)  0 - 99 mg/dL Final   Comment:        Total Cholesterol/HDL:CHD Risk Coronary Heart Disease Risk Table                     Men   Women  1/2 Average Risk   3.4   3.3  Average Risk       5.0   4.4  2 X Average Risk   9.6   7.1  3 X Average Risk  23.4   11.0        Use the calculated Patient Ratio above and the CHD Risk Table to determine the patient's CHD Risk.        ATP III CLASSIFICATION (LDL):  <100     mg/dL   Optimal  528-413  mg/dL   Near or Above                    Optimal  130-159  mg/dL   Borderline  244-010  mg/dL   High  >272     mg/dL   Very High Performed at [facility], [address] [place], [place] [address]    TSH 10/20/2022 0.879  0.350 - 4.500 uIU/mL Final   Comment: Performed by a 3rd Generation assay with a functional sensitivity of <=0.01 uIU/mL. Performed at [facility], [address] [place], [place] [address]     Allergies: Pork-derived products  Medications:  Facility Ordered Medications  Medication   acetaminophen (TYLENOL) tablet 650 mg   alum & mag hydroxide-simeth (MAALOX/MYLANTA) 200-200-20 MG/5ML suspension 30 mL   magnesium hydroxide (MILK OF MAGNESIA) suspension 30 mL   hydrOXYzine (ATARAX) tablet 25 mg   traZODone (DESYREL) tablet 50 mg   dicyclomine (BENTYL) tablet 20 mg   loperamide (IMODIUM) capsule 2-4 mg   methocarbamol (ROBAXIN) tablet 500 mg   naproxen (NAPROSYN) tablet 500 mg   ondansetron (ZOFRAN-ODT) disintegrating tablet 4 mg   thiamine (VITAMIN B1) injection 100 mg   [START ON 10/21/2022] thiamine (VITAMIN B1) tablet 100 mg   multivitamin with minerals tablet 1 tablet   LORazepam (ATIVAN) tablet 1 mg   loperamide (IMODIUM) capsule 2-4 mg   ondansetron (ZOFRAN-ODT) disintegrating tablet 4 mg   PTA Medications  Medication Sig   Rivaroxaban 15 & 20 MG TBPK  Take as directed on package: Start with one 15mg  tablet by mouth twice a day with food. On Day 22, switch to one 20mg  tablet once a day with food.   acetaminophen (TYLENOL) 500 MG tablet Take 1 tablet (500 mg total) by mouth every 6 (six) hours as needed.   ibuprofen (ADVIL) 600 MG tablet Take 1 tablet (600 mg total) by mouth every 6 (six) hours as needed.   cephALEXin (KEFLEX) 500 MG capsule Take 1 capsule (500 mg total) by mouth 4 (four) times daily.    Long Term Goals: Improvement in symptoms so as ready for discharge  Short Term Goals: Patient will verbalize feelings in meetings with treatment team members., Patient will attend at least of 50% of the groups daily., Pt  will complete the PHQ9 on admission, day 3 and discharge., and Patient will take medications as prescribed daily.  Medical Decision Making   Patient admitted to the [facility] Based Crisis unit for substance abuse treatment and mood stabilization. Patient is voluntary.   Lab Orders         CBC with Differential/Platelet         Comprehensive metabolic panel         Hemoglobin A1c         Ethanol         Lipid panel         TSH         POCT Urine Drug Screen - (I-Screen)    EKG  Opiate use Will add scheduled COWS for withdrawal and add prn medications for withdrawal symptoms. Will consider add clonidine, pending UDS and severity of withdrawal.   Alcohol use Will add ativan 1 mg po Q6H prn for CIWA greater than 10 Will add scheduled CIWA for withdrawal  Mood disorder Consider adding antidepressant     Recommendations  Based on my evaluation the patient does not appear to have an emergency medical condition.  Layla Barter, NP 10/20/22  6:30 PM

## 2022-10-20 NOTE — ED Provider Notes (Signed)
Behavioral Health Urgent Care Medical Screening Exam  Patient Name: Edward Barr MRN: [id number] Date of Evaluation: 10/20/22 Chief Complaint:  seeking substance abuse treatment and detox.  Diagnosis:  Final diagnoses:  Substance use disorder    History of Present illness: Edward Barr is a 52 year old male with no past psychiatric history who presents to the [facility] behavioral health urgent care voluntary unaccompanied seeking substance abuse treatment and detox.    Patient reports abusing cocaine since age 83. On average, he reports using a gram of cocaine every other day. He states that he last used cocaine last night up until this morning at 4 AM.    He reports abusing opiates, mostly Percocets for the past 3 to 4 months on average he has been consuming 2 pills (20 mg) per day. He reports last using Percocet yesterday. He reports opiate withdrawal symptoms of abdominal cramps.   He reports drinking alcohol every day, on average 2 (12 ounce) beers per day. He reports drinking alcohol since age 88. He reports last drinking alcohol yesterday, 4 (12 oz) cans of beer. He denies alcohol withdrawal symptoms. He denies a history of alcohol withdrawal, seizures, or delirium tremens.    He states that he can go days without drinking beer and does not have alcohol withdrawals. He states that his longest sobriety was 10 years ago  when he was in prison for drug trafficking from 89-2000. He denies legal issues at this time.   He denies past substance abuse treatments. He states that he is motivated for treatment at this time because he is tired of living like this, it is interfering with his relationship with his fiance and he wants to be able to stop using drugs so that he can get a job. He is currently unemployed.   He reports feeling depressed. He describes his depressive symptoms as feeling sad about his life. PHQ-9 is 10.  He denies AVH but reports past AH for the past 3 years due to drug  use. There is no objective evidence that the patient is responding to internal or external stimuli or experiencing paranoia or delusional thought content. He denies SI/HI.    He denies a past psychiatric history. He denies past suicide attempts. He denies past inpatient hospitalizations. He denies outpatient psychiatry or therapy. He reports a family psychiatric history of father has a history of alcoholism.    He states that he is currently separated from his fiance Edward Barr [phone] due to his drug use. He states that he has been living at a motel for the past 2 weeks. He denies current medical problems. He denies taking prescribed or over-the-counter medications at this time. He states that he has 9 children ages 50, 25, 19, 61, 15, 60 79, 80, and 72. He reports having 32 grandkids.   Flowsheet Row ED from 10/20/2022 in [facility] Most recent reading at 10/20/2022  4:48 PM ED from 10/20/2022 in [facility] Most recent reading at 10/20/2022  4:03 PM ED from 08/28/2021 in [facility] Emergency Department at [facility] Most recent reading at 08/28/2021  3:33 PM  C-SSRS RISK CATEGORY No Risk No Risk No Risk       Psychiatric Specialty Exam  Presentation  General Appearance:Appropriate for Environment  Eye Contact:Fair  Speech:Clear and Coherent  Speech Volume:Normal  Handedness:Right   Mood and Affect  Mood: Depressed  Affect: Congruent   Thought Process  Thought Processes: Coherent; Goal Directed  Descriptions of Associations:Intact  Orientation:Full (Time, Place and Person)  Thought Content:Logical    Hallucinations:None  Ideas of Reference:None  Suicidal Thoughts:No  Homicidal Thoughts:No   Sensorium  Memory: Immediate Fair; Recent Fair; Remote Fair  Judgment: Intact  Insight: Present   Executive Functions  Concentration: Fair  Attention Span: Fair  Recall: [employer] of  Knowledge: Fair  Language: Fair   Psychomotor Activity  Psychomotor Activity: Normal   Assets  Assets: Manufacturing systems engineer; Desire for Improvement; Leisure Time; Physical Health   Sleep  Sleep: Poor  Number of hours:  4   Physical Exam: Physical Exam HENT:     Head: Normocephalic.     Nose: Nose normal.  Eyes:     Conjunctiva/sclera: Conjunctivae normal.  Cardiovascular:     Rate and Rhythm: Normal rate.  Pulmonary:     Effort: Pulmonary effort is normal.  Musculoskeletal:        General: Normal range of motion.     Cervical back: Normal range of motion.  Neurological:     Mental Status: He is alert and oriented to person, place, and time.    Review of Systems  Constitutional: Negative.   HENT: Negative.    Eyes: Negative.   Respiratory: Negative.    Cardiovascular: Negative.   Gastrointestinal:  Positive for abdominal pain.  Genitourinary: Negative.   Musculoskeletal: Negative.   Neurological: Negative.   Endo/Heme/Allergies: Negative.    Blood pressure (!) 144/79, pulse 81, temperature 98 F (36.7 C), temperature source Oral, resp. rate 18, SpO2 99 %. There is no height or weight on file to calculate BMI.  Musculoskeletal: Strength & Muscle Tone: within normal limits Gait & Station: normal Patient leans: N/A   [facility] MSE Discharge Disposition for Follow up and Recommendations: Based on my evaluation I certify that psychiatric inpatient services furnished can reasonably be expected to improve the patient's condition which I recommend transfer to an appropriate accepting facility.   Patient accepted to the [facility] for substance abuse treatment and mood stabilization.    Layla Barter, NP 10/20/2022, 7:12 PM

## 2022-10-20 NOTE — ED Notes (Signed)
Progress note   D: Pt denies SI, HI, VH. Endorses AH that is non-command.  Pt rates pain  0/10. Pt rates anxiety  0/10 and depression  0/10.Pt denies any withdrawal symptoms from alcohol now or in the past. Pt endorses stomach cramping. LBM was today,10/20/22. Denies issues with sleep or appetite. No other concerns noted at this time.  A: Pt provided support and encouragement. Pt given scheduled medication as prescribed. PRNs as appropriate. Q15 min checks for safety.   R: Pt safe on the unit. Will continue to monitor.

## 2022-10-20 NOTE — ED Notes (Signed)
Patient discharge to [facility] 

## 2022-10-20 NOTE — Progress Notes (Signed)
Pt is admitted to [facility] due to substance and requesting detox. Pt is alert and oriented X4. Pt is ambulatory and is oriented to staff/unit. Pt was cooperative with admission process and skin assessment. Scattered scars were noted on pt's body. Pt endorses AH and reports that he hears a male screaming. Per pt, it has been ongoing for approximately three years. Pt complained of stomach ache. No signs of acute distress noted. Pt denies current SI/HI/VH but reports wanting to "hurt people" but did not name any person. 15 minutes safety checks initiated. Staff will monitor for pt's safety.

## 2022-10-20 NOTE — ED Notes (Signed)
Pt was given dinner 

## 2022-10-20 NOTE — Progress Notes (Signed)
   10/20/22 1532  [facility] Triage Screening (Walk-ins at [facility] only)  How Did You Hear About [place]? Self  What Is the Reason for Your Visit/Call Today? Pt presents to [facility] voluntarily seeking detox for cocaine and opioid use. Pt reports using $50 worth of cocaine between lastnight and this morning, he also reports using 2-20mg  percocet pills and 4-12oz beers. Pt reports his substance use has caused issues in his relationship which is one of the reasons for wanting to get help. Pt reports auditory hallucinations hearing screaming yesterday. Pt denies SI/HI and AVH currently.  How Long Has This Been Causing You Problems? <Week  Have You Recently Had Any Thoughts About Hurting Yourself? No  Are You Planning to Commit Suicide/Harm Yourself At This time? No  Have you Recently Had Thoughts About Hurting Someone Karolee Ohs? No  Are You Planning To Harm Someone At This Time? No  Are you currently experiencing any auditory, visual or other hallucinations? No  Have You Used Any Alcohol or Drugs in the Past 24 Hours? Yes  How long ago did you use Drugs or Alcohol? yesterday and today  What Did You Use and How Much? 50 worth of cocaine between lastnight and this morning, he also reports using 2-20mg  percocet pills and 4-12oz beers  Do you have any current medical co-morbidities that require immediate attention? No  Clinician description of patient physical appearance/behavior: calm, cooperative  What Do You Feel Would Help You the Most Today? Alcohol or Drug Use Treatment  If access to [facility] was not available, would you have sought care in the Emergency Department? No  Determination of Need Urgent (48 hours)  Options For Referral Facility-Based Crisis

## 2022-10-21 ENCOUNTER — Encounter (HOSPITAL_COMMUNITY): Payer: Self-pay

## 2022-10-21 DIAGNOSIS — F1113 Opioid abuse with withdrawal: Secondary | ICD-10-CM | POA: Diagnosis not present

## 2022-10-21 NOTE — BH IP Treatment Plan (Signed)
Interdisciplinary Treatment and Diagnostic Plan Update  10/21/2022 Time of Session: 9:45AM Edward Barr MRN: [id number]  Diagnosis:  Final diagnoses:  Substance abuse (HCC)  Cocaine abuse (HCC)  Opiate use  Substance induced mood disorder (HCC)     Current Medications:  Current Facility-Administered Medications  Medication Dose Route Frequency Provider Last Rate Last Admin   acetaminophen (TYLENOL) tablet 650 mg  650 mg Oral Q6H PRN White, Patrice L, NP       alum & mag hydroxide-simeth (MAALOX/MYLANTA) 200-200-20 MG/5ML suspension 30 mL  30 mL Oral Q4H PRN White, Patrice L, NP       dicyclomine (BENTYL) tablet 20 mg  20 mg Oral Q6H PRN White, Patrice L, NP   20 mg at 10/20/22 2111   hydrOXYzine (ATARAX) tablet 25 mg  25 mg Oral TID PRN White, Patrice L, NP       loperamide (IMODIUM) capsule 2-4 mg  2-4 mg Oral PRN White, Patrice L, NP       LORazepam (ATIVAN) tablet 1 mg  1 mg Oral Q6H PRN White, Patrice L, NP       magnesium hydroxide (MILK OF MAGNESIA) suspension 30 mL  30 mL Oral Daily PRN White, Patrice L, NP       methocarbamol (ROBAXIN) tablet 500 mg  500 mg Oral Q8H PRN White, Patrice L, NP       multivitamin with minerals tablet 1 tablet  1 tablet Oral Daily White, Patrice L, NP   1 tablet at 10/21/22 0931   naproxen (NAPROSYN) tablet 500 mg  500 mg Oral BID PRN White, Patrice L, NP       ondansetron (ZOFRAN-ODT) disintegrating tablet 4 mg  4 mg Oral Q6H PRN White, Patrice L, NP       thiamine (VITAMIN B1) tablet 100 mg  100 mg Oral Daily White, Patrice L, NP   100 mg at 10/21/22 0931   traZODone (DESYREL) tablet 50 mg  50 mg Oral QHS PRN White, Patrice L, NP       Current Outpatient Medications  Medication Sig Dispense Refill   Rivaroxaban 15 & 20 MG TBPK Take as directed on package: Start with one 15mg  tablet by mouth twice a day with food. On Day 22, switch to one 20mg  tablet once a day with food. 51 each 0   PTA Medications: Prior to Admission medications   Medication  Sig Start Date End Date Taking? Authorizing Provider  Rivaroxaban 15 & 20 MG TBPK Take as directed on package: Start with one 15mg  tablet by mouth twice a day with food. On Day 22, switch to one 20mg  tablet once a day with food. 07/30/18   Shaune Pollack, MD    Patient Stressors: Financial difficulties   Marital or family conflict   Substance abuse   Other: homelessness    Patient Strengths: Ability for insight  Average or above average intelligence  Capable of independent living  Communication skills  General fund of knowledge  Motivation for treatment/growth  Supportive family/friends   Treatment Modalities: Medication Management, Group therapy, Case management,  1 to 1 session with clinician, Psychoeducation, Recreational therapy.   Physician Treatment Plan for Primary and Secondary Diagnosis:  Final diagnoses:  Substance abuse (HCC)  Cocaine abuse (HCC)  Opiate use  Substance induced mood disorder (HCC)   Long Term Goal(s): Improvement in symptoms so as ready for discharge  Short Term Goals: Patient will verbalize feelings in meetings with treatment team members. Patient will attend at least of  50% of the groups daily. Pt will complete the PHQ9 on admission, day 3 and discharge. Patient will take medications as prescribed daily.  Medication Management: Evaluate patient's response, side effects, and tolerance of medication regimen.  Therapeutic Interventions: 1 to 1 sessions, Unit Group sessions and Medication administration.  Evaluation of Outcomes: Progressing  LCSW Treatment Plan for Primary Diagnosis:  Final diagnoses:  Substance abuse (HCC)  Cocaine abuse (HCC)  Opiate use  Substance induced mood disorder (HCC)    Long Term Goal(s): Safe transition to appropriate next level of care at discharge.  Short Term Goals: Facilitate acceptance of mental health diagnosis and concerns through verbal commitment to aftercare plan and appointments at discharge., Patient  will identify one social support prior to discharge to aid in patient's recovery., Patient will attend AA/NA groups as scheduled., Identify minimum of 2 triggers associated with mental health/substance abuse issues with treatment team members., and Increase skills for wellness and recovery by attending 50% of scheduled groups.  Therapeutic Interventions: Assess for all discharge needs, 1 to 1 time with Child psychotherapist, Explore available resources and support systems, Assess for adequacy in community support network, Educate family and significant other(s) on suicide prevention, Complete Psychosocial Assessment, Interpersonal group therapy.  Evaluation of Outcomes: Progressing   Progress in Treatment: Attending groups: Yes. Participating in groups: Yes. Taking medication as prescribed: Yes. Toleration medication: Yes. Family/Significant other contact made: No, will contact:  Patient provided permission for LCSW to follow up with his fiance Candace [phone].  Patient understands diagnosis: Yes. Discussing patient identified problems/goals with staff: Yes. Medical problems stabilized or resolved: Yes. Denies suicidal/homicidal ideation: Yes. Issues/concerns per patient self-inventory: Yes. Other: substance use and need for further treatment  New problem(s) identified: No, Describe:  other than reported on admission.   New Short Term/Long Term Goal(s): Safe transition to appropriate next level of care at discharge, Engage patient in therapeutic group addressing interpersonal concerns. Engage patient in aftercare planning with referrals and resources, Increase ability to appropriately verbalize feelings, Facilitate acceptance of mental health diagnosis and concerns and Identify triggers associated with mental health/substance abuse issues.   Patient Goals:  Patient is seeking residential placement at this time for substance use.   Discharge Plan or Barriers: LCSW will send referrals out for  review for residential placement. Updates will be provided as received.   Reason for Continuation of Hospitalization: Withdrawal symptoms  Estimated Length of Stay: 3-5 days  Last 3 [place] Suicide Severity Risk Score: Flowsheet Row ED from 10/20/2022 in [facility] Most recent reading at 10/20/2022  4:48 PM ED from 10/20/2022 in [facility] Most recent reading at 10/20/2022  4:03 PM ED from 08/28/2021 in [facility] Department at [facility] Most recent reading at 08/28/2021  3:33 PM  C-SSRS RISK CATEGORY No Risk No Risk No Risk       Last PHQ 2/9 Scores:    10/20/2022    6:48 PM  Depression screen PHQ 2/9  Decreased Interest 3  Down, Depressed, Hopeless 2  PHQ - 2 Score 5  Altered sleeping 1  Tired, decreased energy 2  Change in appetite 0  Feeling bad or failure about yourself  2  Trouble concentrating 0  Moving slowly or fidgety/restless 0  Suicidal thoughts 0  PHQ-9 Score 10  Difficult doing work/chores Somewhat difficult    Scribe for Treatment Team: Lenny Pastel 10/21/2022 11:59 AM

## 2022-10-21 NOTE — Group Note (Signed)
Group Topic: Balance in Life  Group Date: 10/21/2022 Start Time: 0730 End Time: 0910 Facilitators: Rae Lips B  Department: [facility]  Number of Participants: 6  Group Focus: acceptance Treatment Modality:  Exposure Therapy Interventions utilized were leisure development Purpose: increase insight  Name: Edward Barr Date of Birth: [birth year]  MR: [id number]    Level of Participation: active Quality of Participation: attentive Interactions with others: gave feedback Mood/Affect: appropriate Triggers (if applicable): NA Cognition: coherent/clear Progress: Gaining insight Response: NA Plan: patient will be encouraged to keep going to groups/.  Patients Problems:  Patient Active Problem List   Diagnosis Date Noted   Substance use disorder 10/20/2022

## 2022-10-21 NOTE — Tx Team (Signed)
LCSW, MD, and Resident met with patient to assess current mood, affect, physical state, and inquire about needs/goals while here in [facility] and after discharge. Patient reports he presented due to needing help with detox and seeking residential placement. Patient reports he has been using cocaine and "roxies", however reports cocaine is his drug of choice. Patient reports daily use of cocaine and reports it has destroyed relationships and he is tired of losing everything. Patient reports he places the drug before everything and he knows he needs help. Patient reports he has a girlfriend of 12 years, 9 children, and 32 grandchildren. Patient reports he has to get his life on track and do better not just for himself but for his family as well. Patient reports having support from his girlfriend, however reports she is at her last straw with him. Patient reports he also receives support from his mother. Patient reports he has never sought treatment in the past and reports his motivation to change is to restore his relationships. Patient reports he has been living in a motel for the last 2 weeks. Patient reports he hustles to get his money to afford his place to stay and substances. 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 Patient reports when he was working he has no desire to use drugs. Patient reports it kept his mind busy and helped him remain active. Patient denies any prior outpatient or inpatient treatment for MH or substance use. Patient reports his goal at this time is to seek residential treatment for his substance use. Patient reports being open to a 28-30 day program. Patient reports he would not like to do any longer term program, as he would like to see how the shorter term program would work for him first. Patient aware that LCSW will send referrals out for review and will follow up to provide updates as  received. Patient expressed understanding and appreciation of LCSW assistance. No other needs were reported at this time by patient.   Referral has been sent to [facility] and [facility] for review. LCSW will continue to follow and provide support to patient while on [facility] unit.   Fernande Boyden, LCSW Clinical Social Worker [place] [facility] Ph: [phone]
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
## 2022-10-21 NOTE — ED Notes (Signed)
Pt in room asleep.  Breathing is unlabored and even. Will continue to monitor for safety.  ?

## 2022-10-21 NOTE — ED Notes (Signed)
Pt asleep at this hour. No apparent distress. RR even and unlabored. Monitored for safety.  

## 2022-10-21 NOTE — ED Notes (Signed)
Pt denies any issues this morning. Slept well last night. CIWA=0.

## 2022-10-21 NOTE — ED Notes (Signed)
Pt is in the Dayroom watching TV. Respirations are even and unlabored. No acute distress noted. Will continue to monitor for safety.  

## 2022-10-21 NOTE — ED Notes (Signed)
Pt is in the bed sleeping. Respirations are even and unlabored. No acute distress noted. Will continue to monitor for safety. 

## 2022-10-21 NOTE — ED Notes (Signed)
Patient remains asleep in bed without issue or compliant.  No evidence of withdrawal.  Will monitor.

## 2022-10-21 NOTE — ED Notes (Signed)
Patient alert and oriented. Affect/mood. Denies SI, HI, AVH, and pain. Scheduled medications administered to patient, per MD orders. Support and encouragement provided.  Routine safety checks conducted every 15 minutes.  Patient informed to notify staff with problems or concerns. No adverse drug reactions noted. Patient contracts for safety at this time. Patient compliant with medications and treatment plan. Patient receptive, calm, and cooperative. Patient interacts well with others on the unit.  Patient remains safe at this time.

## 2022-10-22 DIAGNOSIS — F1113 Opioid abuse with withdrawal: Secondary | ICD-10-CM | POA: Diagnosis not present

## 2022-10-22 MED ORDER — DOCUSATE SODIUM 100 MG PO CAPS
100.0000 mg | ORAL_CAPSULE | Freq: Once | ORAL | Status: AC
  Administered 2022-10-22: 100 mg via ORAL
  Filled 2022-10-22: qty 1

## 2022-10-22 NOTE — ED Notes (Signed)
Pt is currently sleeping, no distress noted, environmental check complete, will continue to monitor patient for safety.  

## 2022-10-22 NOTE — Group Note (Signed)
Group Topic: Relapse and Recovery  Group Date: 10/22/2022 Start Time: 2000 End Time: 2100 Facilitators: AA Department: [facility]  Number of Participants: 5 Group Focus: abuse issues Treatment Modality:  Patient-Centered Therapy Interventions utilized were group exercise Purpose: relapse prevention strategies and trigger / craving management  Name: Edward Barr Date of Birth: Sep 27, 1970  MR: [id number]    Level of Participation: active Quality of Participation: cooperative Interactions with others: gave feedback Mood/Affect: appropriate Triggers (if applicable): people, places and things related to patients drug of choice Cognition: coherent/clear Progress: Significant Response: In agreeance with treatment plan Plan: referral / recommendations  Patients Problems:  Patient Active Problem List   Diagnosis Date Noted   Substance use disorder 10/20/2022

## 2022-10-22 NOTE — ED Notes (Signed)
Patient remains asleep in bed at this time without issue or complaint.  Patient denies withdrawal symptoms.  Will monitor and provide support as needed.

## 2022-10-22 NOTE — ED Provider Notes (Cosign Needed Addendum)
Behavioral Health Progress Note  Date and Time: 10/22/2022 10:23 AM Name: Edward Barr MRN:  [id number]  Subjective:  Edward Barr stated " I need to talk to the social worker to get a hotel voucher."  Evaluation:  Edward Barr was seen and evaluated face-to-face.  He presents with a bright and pleasant affect.  Denying suicidal or homicidal ideations.  Denies auditory or visual hallucinations.  He does admit to auditory hallucinations with substance abuse use.  States he has been using Roxie and crack cocaine prior to this admission.  States that he was planning to start a new job in [employer] however he needed to get his substance abuse under control.  States he is unsure if he would like to attend residential treatment after this as he reports " as long as I have a place to stay then I am good."   Edward Barr reports he is originally from [place] denies that he has additional family support.  In [place].  Does admit to depressive symptoms however states he is not interested in starting any antidepressants at this time.  He reports a good appetite.  States he is resting well throughout the night.  Patient reports concerns related to constipation.  Will initiate Colace and milk of magnesium.  Support, encouragement and reassurance was provided.   Edward Barr is sitting; he is alert/oriented x 4; calm/cooperative; and mood congruent with affect.  Patient is speaking in a clear tone at moderate volume, and normal pace; with good eye contact. His thought process is coherent and relevant; There is no indication that he is currently responding to internal/external stimuli or experiencing delusional thought content.  Patient denies suicidal/self-harm/homicidal ideation, psychosis, and paranoia.  Patient has remained calm throughout assessment and has answered questions appropriately  Patient has a charted patient with polysubstance abuse, paranoia and posttraumatic stress disorder.  Currently prescribed Ativan/ CIWA  monitoring.  Denying any withdrawal symptoms and/or cravings.  Diagnosis:  Final diagnoses:  Substance abuse (HCC)  Cocaine abuse (HCC)  Opiate use  Substance induced mood disorder (HCC)    Total Time spent with patient: 15 minutes  Past Psychiatric History: See HPI Past Medical History: See HPI Family History: See HPI Family Psychiatric  History: See HPI Social History: See HPI  Additional Social History:                         Sleep: Good  Appetite:  Good  Current Medications:  Current Facility-Administered Medications  Medication Dose Route Frequency Provider Last Rate Last Admin   acetaminophen (TYLENOL) tablet 650 mg  650 mg Oral Q6H PRN White, Patrice L, NP       alum & mag hydroxide-simeth (MAALOX/MYLANTA) 200-200-20 MG/5ML suspension 30 mL  30 mL Oral Q4H PRN White, Patrice L, NP       dicyclomine (BENTYL) tablet 20 mg  20 mg Oral Q6H PRN White, Patrice L, NP   20 mg at 10/20/22 2111   docusate sodium (COLACE) capsule 100 mg  100 mg Oral Once Oneta Rack, NP       hydrOXYzine (ATARAX) tablet 25 mg  25 mg Oral TID PRN White, Patrice L, NP       loperamide (IMODIUM) capsule 2-4 mg  2-4 mg Oral PRN White, Patrice L, NP       LORazepam (ATIVAN) tablet 1 mg  1 mg Oral Q6H PRN White, Patrice L, NP       magnesium hydroxide (MILK OF MAGNESIA) suspension 30  mL  30 mL Oral Daily PRN White, Patrice L, NP   30 mL at 10/22/22 0855   methocarbamol (ROBAXIN) tablet 500 mg  500 mg Oral Q8H PRN White, Patrice L, NP       multivitamin with minerals tablet 1 tablet  1 tablet Oral Daily White, Patrice L, NP   1 tablet at 10/22/22 0855   naproxen (NAPROSYN) tablet 500 mg  500 mg Oral BID PRN White, Patrice L, NP       ondansetron (ZOFRAN-ODT) disintegrating tablet 4 mg  4 mg Oral Q6H PRN White, Patrice L, NP       thiamine (VITAMIN B1) tablet 100 mg  100 mg Oral Daily White, Patrice L, NP   100 mg at 10/22/22 0855   traZODone (DESYREL) tablet 50 mg  50 mg Oral QHS PRN  White, Patrice L, NP       Current Outpatient Medications  Medication Sig Dispense Refill   Rivaroxaban 15 & 20 MG TBPK Take as directed on package: Start with one 15mg  tablet by mouth twice a day with food. On Day 22, switch to one 20mg  tablet once a day with food. (Patient not taking: Reported on 10/21/2022) 51 each 0    Labs  Lab Results:  Admission on 10/20/2022  Component Date Value Ref Range Status   WBC 10/20/2022 5.6  4.0 - 10.5 K/uL Final   RBC 10/20/2022 4.98  4.22 - 5.81 MIL/uL Final   Hemoglobin 10/20/2022 13.0  13.0 - 17.0 g/dL Final   HCT 08/65/7846 40.3  39.0 - 52.0 % Final   MCV 10/20/2022 80.9  80.0 - 100.0 fL Final   MCH 10/20/2022 26.1  26.0 - 34.0 pg Final   MCHC 10/20/2022 32.3  30.0 - 36.0 g/dL Final   RDW 96/29/5284 12.9  11.5 - 15.5 % Final   Platelets 10/20/2022 348  150 - 400 K/uL Final   nRBC 10/20/2022 0.0  0.0 - 0.2 % Final   Neutrophils Relative % 10/20/2022 62  % Final   Neutro Abs 10/20/2022 3.4  1.7 - 7.7 K/uL Final   Lymphocytes Relative 10/20/2022 31  % Final   Lymphs Abs 10/20/2022 1.7  0.7 - 4.0 K/uL Final   Monocytes Relative 10/20/2022 5  % Final   Monocytes Absolute 10/20/2022 0.3  0.1 - 1.0 K/uL Final   Eosinophils Relative 10/20/2022 2  % Final   Eosinophils Absolute 10/20/2022 0.1  0.0 - 0.5 K/uL Final   Basophils Relative 10/20/2022 0  % Final   Basophils Absolute 10/20/2022 0.0  0.0 - 0.1 K/uL Final   Immature Granulocytes 10/20/2022 0  % Final   Abs Immature Granulocytes 10/20/2022 0.02  0.00 - 0.07 K/uL Final   Performed at [facility], [address], [place], [place] [address]   Sodium 10/20/2022 134 (L)  135 - 145 mmol/L Final   Potassium 10/20/2022 3.8  3.5 - 5.1 mmol/L Final   Chloride 10/20/2022 99  98 - 111 mmol/L Final   CO2 10/20/2022 26  22 - 32 mmol/L Final   Glucose, Bld 10/20/2022 104 (H)  70 - 99 mg/dL Final   Glucose reference range applies only to samples taken after fasting for at least 8 hours.   BUN  10/20/2022 8  6 - 20 mg/dL Final   Creatinine, Ser 10/20/2022 1.04  0.61 - 1.24 mg/dL Final   Calcium 05/04/7251 9.1  8.9 - 10.3 mg/dL Final   Total Protein 66/44/0347 7.5  6.5 - 8.1  g/dL Final   Albumin 25/36/6440 4.0  3.5 - 5.0 g/dL Final   AST 34/74/2595 20  15 - 41 U/L Final   ALT 10/20/2022 19  0 - 44 U/L Final   Alkaline Phosphatase 10/20/2022 68  38 - 126 U/L Final   Total Bilirubin 10/20/2022 1.1  0.3 - 1.2 mg/dL Final   GFR, Estimated 10/20/2022 >60  >60 mL/min Final   Comment: (NOTE) Calculated using the CKD-EPI Creatinine Equation (2021)    Anion gap 10/20/2022 9  5 - 15 Final   Performed at [facility], [address], [place], [place] [address]   Hgb A1c MFr Bld 10/20/2022 5.3  4.8 - 5.6 % Final   Comment: (NOTE) Pre diabetes:          5.7%-6.4%  Diabetes:              >6.4%  Glycemic control for   <7.0% adults with diabetes    Mean Plasma Glucose 10/20/2022 105.41  mg/dL Final   Performed at [facility], [address] [place], [place] [address]   Alcohol, Ethyl (B) 10/20/2022 <10  <10 mg/dL Final   Comment: (NOTE) Lowest detectable limit for serum alcohol is 10 mg/dL.  For medical purposes only. Performed at [facility], [address], [place], [place] [address]    Cholesterol 10/20/2022 217 (H)  0 - 200 mg/dL Final   Triglycerides 41/66/0630 50  <150 mg/dL Final   HDL 16/05/930 67  >40 mg/dL Final   Total CHOL/HDL Ratio 10/20/2022 3.2  RATIO Final   VLDL 10/20/2022 10  0 - 40 mg/dL Final   LDL Cholesterol 10/20/2022 140 (H)  0 - 99 mg/dL Final   Comment:        Total Cholesterol/HDL:CHD Risk Coronary Heart Disease Risk Table                     Men   Women  1/2 Average Risk   3.4   3.3  Average Risk       5.0   4.4  2 X Average Risk   9.6   7.1  3 X Average Risk  23.4   11.0        Use the calculated Patient Ratio above and the CHD Risk Table to determine the patient's CHD Risk.        ATP III CLASSIFICATION (LDL):  <100      mg/dL   Optimal  355-732  mg/dL   Near or Above                    Optimal  130-159  mg/dL   Borderline  202-542  mg/dL   High  >706     mg/dL   Very High Performed at [facility], [address] [place], [place] [address]    TSH 10/20/2022 0.879  0.350 - 4.500 uIU/mL Final   Comment: Performed by a 3rd Generation assay with a functional sensitivity of <=0.01 uIU/mL. Performed at [facility], [address] [place], [place] [address]     Blood Alcohol level:  Lab Results  Component Value Date   [facility] <10 10/20/2022    Metabolic Disorder Labs: Lab Results  Component Value Date   HGBA1C 5.3 10/20/2022   MPG 105.41 10/20/2022   No results found for: "PROLACTIN" Lab Results  Component Value Date   CHOL 217 (H) 10/20/2022   TRIG 50  10/20/2022   HDL 67 10/20/2022   CHOLHDL 3.2 10/20/2022   VLDL 10 10/20/2022   LDLCALC 140 (H) 10/20/2022    Therapeutic Lab Levels: No results found for: "LITHIUM" No results found for: "VALPROATE" No results found for: "CBMZ"  Physical Findings   PHQ2-9    Flowsheet Row ED [facility]20/2022 in [facility]  PHQ-2 Total Score 5  PHQ-9 Total Score 10      Flowsheet Row ED from 10/20/2022 in [facility] Most recent reading at 10/20/2022  4:48 PM ED from 10/20/2022 in [facility] Most recent reading at 10/20/2022  4:03 PM ED from 08/28/2021 in [facility] Emergency Department at [facility] Most recent reading at 08/28/2021  3:33 PM  C-SSRS RISK CATEGORY No Risk No Risk No Risk        Musculoskeletal  Strength & Muscle Tone: within normal limits Gait & Station: normal Patient leans: N/A  Psychiatric Specialty Exam  Presentation  General Appearance:  Appropriate for Environment  Eye Contact: Fair  Speech: Clear and Coherent  Speech Volume: Normal  Handedness: Right   Mood and Affect   Mood: Depressed  Affect: Congruent   Thought Process  Thought Processes: Coherent; Goal Directed  Descriptions of Associations:Intact  Orientation:Full (Time, Place and Person)  Thought Content:Logical     Hallucinations:No data recorded Ideas of Reference:None  Suicidal Thoughts:No data recorded Homicidal Thoughts:No data recorded  Sensorium  Memory: Immediate Fair; Recent Fair; Remote Fair  Judgment: Intact  Insight: Present   Executive Functions  Concentration: Fair  Attention Span: Fair  Recall: [employer] of Knowledge: Fair  Language: Fair   Psychomotor Activity  Psychomotor Activity:No data recorded  Assets  Assets: Communication Skills; Desire for Improvement; Leisure Time; Physical Health   Sleep  Sleep:No data recorded  No data recorded  Physical Exam  Physical Exam Vitals and nursing note reviewed.  Cardiovascular:     Rate and Rhythm: Normal rate and regular rhythm.  Psychiatric:        Mood and Affect: Mood normal.        Behavior: Behavior normal.        Thought Content: Thought content normal.    Review of Systems  HENT: Negative.    Gastrointestinal:  Positive for constipation.  Skin: Negative.   Psychiatric/Behavioral:  Positive for depression. Negative for suicidal ideas. The patient is nervous/anxious.   All other systems reviewed and are negative.  Blood pressure 107/63, pulse (!) 107, temperature 97.9 F (36.6 C), temperature source Tympanic, resp. rate 16, SpO2 100 %. There is no height or weight on file to calculate BMI.  Treatment Plan Summary: Daily contact with patient to assess and evaluate symptoms and progress in treatment and Medication management  Continue with current treatment plan on 10/22/2022 as listed below except were noted  Cocaine abuse: Substance-induced mood disorder:  Continue Ativan/CIWA protocol -Initiated Colace 100 mg for reported constipation -Liquid milk of  magnesia   Patient to continue medications as indicated Patient encouraged to participate in therapeutic milieu CSW to continue working on discharge disposition   Oneta Rack, NP 10/22/2022 10:23 AM

## 2022-10-22 NOTE — Group Note (Signed)
Group Topic: Overcoming Obstacles  Group Date: 10/22/2022 Start Time: 1600 End Time: 1620 Facilitators: Jenean Lindau, RN  Department: [facility]  Number of Participants: 4  Group Focus: chemical dependency issues Treatment Modality:  Patient-Centered Therapy Interventions utilized were patient education Purpose: express feelings and regain self-worth  Name: Edward Barr Date of Birth: [birth year]  MR: [id number]    Level of Participation: active Quality of Participation: attentive Interactions with others: gave feedback Mood/Affect: appropriate Triggers (if applicable):   Cognition: coherent/clear Progress: Gaining insight Response:   Plan: follow-up needed  Patients Problems:  Patient Active Problem List   Diagnosis Date Noted   Substance use disorder 10/20/2022

## 2022-10-22 NOTE — ED Notes (Signed)
Pt is in the bed sleeping. Respirations are even and unlabored. No acute distress noted. Will continue to monitor for safety. 

## 2022-10-22 NOTE — Group Note (Signed)
Group Topic: Wellness  Group Date: 10/22/2022 Start Time: 1230 End Time: 1350 Facilitators: Vonzell Schlatter B  Department: [facility]  Number of Participants: 6  Group Focus: self-awareness Treatment Modality:  Psychoeducation Interventions utilized were support Purpose: reinforce self-care  Name: Sawyer Mentzer Date of Birth: 03/04/1971  MR: [id number]    Level of Participation: minimal Quality of Participation: attentive and cooperative Interactions with others: gave feedback Mood/Affect: positive Triggers (if applicable): n/a Cognition: coherent/clear Progress: Moderate Response: n/a Plan: follow-up needed  Patients Problems:  Patient Active Problem List   Diagnosis Date Noted   Substance use disorder 10/20/2022

## 2022-10-23 DIAGNOSIS — F1113 Opioid abuse with withdrawal: Secondary | ICD-10-CM | POA: Diagnosis not present

## 2022-10-23 NOTE — ED Notes (Signed)
Pt is currently sleeping, no distress noted, environmental check complete, will continue to monitor patient for safety.  

## 2022-10-23 NOTE — Group Note (Unsigned)
Group Topic: Balance in Life  Group Date: 10/23/2022 Start Time: 0730 End Time: 0800 Facilitators: Rae Lips B  Department: [facility]  Number of Participants: 6  Group Focus: activities of daily living skills Treatment Modality:  Exposure Therapy Interventions utilized were leisure development Purpose: express feelings  Name: Edward Barr Date of Birth: August 26, 1970  MR: [id number]    Level of Participation: active Quality of Participation: attentive Interactions with others: gave feedback Mood/Affect: appropriate Triggers (if applicable): NA Cognition: coherent/clear Progress: Gaining insight Response: NA Plan: patient will be encouraged to keep going to groups.   Patients Problems:  Patient Active Problem List   Diagnosis Date Noted   Substance use disorder 10/20/2022

## 2022-10-23 NOTE — ED Notes (Signed)
Patient  sleeping in no acute stress. RR even and unlabored .Environment secured .Will continue to monitor for safely. 

## 2022-10-23 NOTE — ED Notes (Signed)
Patient alert and oriented x 3. Denies SI/HI/AVH. Denies intent or plan to harm self or others. Routine conducted according to faculty protocol. Encourage patient to notify staff with any needs or concerns. Patient verbalized agreement and understanding. Will continue to monitor for safety. 

## 2022-10-23 NOTE — ED Notes (Signed)
Patient requested medication for indegeston

## 2022-10-23 NOTE — ED Notes (Signed)
Patient is currently in the dining room watching television will continue to monitor patient for safety

## 2022-10-23 NOTE — ED Notes (Signed)
Patient states medication helped heartburn

## 2022-10-23 NOTE — ED Notes (Signed)
Patient in milieu. Environment is secured. Will continue to monitor for safety. 

## 2022-10-23 NOTE — Group Note (Signed)
Group Topic: Communication  Group Date: 10/23/2022 Start Time: 1100 End Time: 1115 Facilitators: Merrie Roof, RN  Department: [facility]  Number of Participants: 6  Group Focus: safety plan Treatment Modality:  Behavior Modification Therapy Interventions utilized were assignment Purpose: enhance coping skills  Name: Tevan Marian Date of Birth: 18-Feb-1971  MR: [id number]    Level of Participation: active Quality of Participation: attentive Interactions with others: gave feedback Mood/Affect: appropriate Triggers (if applicable):  Cognition: coherent/clear Progress: Significant Response:  Plan: patient will be encouraged to continue with therapy  Patients Problems:  Patient Active Problem List   Diagnosis Date Noted   Substance use disorder 10/20/2022

## 2022-10-23 NOTE — ED Provider Notes (Signed)
Behavioral Health Progress Note  Date and Time: 10/23/2022 12:34 PM Name: Edward Barr MRN:  [id number]  Subjective: Edward Barr is a 52 year old African-American male who presents with a history of polysubstance abuse, substance-induced mood disorder and generalized anxiety.  Patient was seen and evaluated face-to-face by this provider.   Edward Barr presented slightly irritable throughout this assessment,as it was reported that patient had requested to discharge late on yesterday (10/22/2022).  States his daughter waited in the lobby for 3 hours for somebody to decide if he is able to leave and go to [place] with her.  He reports frustration related to the discharge process.    Edward Barr is declining discharge at this time.  States he needs to follow-up with social work regarding a [employer] housing.  He reports his main concern is related to housing.  denying suicidal or homicidal ideations.  Denies auditory visual hallucinations.  Denies any medical concerns at this time.  States he was able to have a bowel movement yesterday.  Feeling better overall.  He reports a good appetite.  States he is resting well throughout the night.  PHQ-9 =2.  Edward Barr denies depression or depressive symptoms.  Support , encouragement  and reassurance was provided.  Diagnosis:  Final diagnoses:  Substance abuse (HCC)  Cocaine abuse (HCC)  Opiate use  Substance induced mood disorder (HCC)    Total Time spent with patient: 15 minutes  Past Psychiatric History: See HPI Past Medical History: See HPI Family History: See HPI Family Psychiatric  History: See HPI Social History: See HPI  Additional Social History:                         Sleep: Fair  Appetite:  Good  Current Medications:  Current Facility-Administered Medications  Medication Dose Route Frequency Provider Last Rate Last Admin   acetaminophen (TYLENOL) tablet 650 mg  650 mg Oral Q6H PRN White, Patrice L, NP       alum &  mag hydroxide-simeth (MAALOX/MYLANTA) 200-200-20 MG/5ML suspension 30 mL  30 mL Oral Q4H PRN White, Patrice L, NP   30 mL at 10/23/22 1036   dicyclomine (BENTYL) tablet 20 mg  20 mg Oral Q6H PRN White, Patrice L, NP   20 mg at 10/20/22 2111   hydrOXYzine (ATARAX) tablet 25 mg  25 mg Oral TID PRN Liborio Nixon L, NP   25 mg at 10/22/22 2153   loperamide (IMODIUM) capsule 2-4 mg  2-4 mg Oral PRN White, Patrice L, NP       LORazepam (ATIVAN) tablet 1 mg  1 mg Oral Q6H PRN White, Patrice L, NP       magnesium hydroxide (MILK OF MAGNESIA) suspension 30 mL  30 mL Oral Daily PRN White, Patrice L, NP   30 mL at 10/22/22 0855   methocarbamol (ROBAXIN) tablet 500 mg  500 mg Oral Q8H PRN White, Patrice L, NP       multivitamin with minerals tablet 1 tablet  1 tablet Oral Daily White, Patrice L, NP   1 tablet at 10/23/22 1036   naproxen (NAPROSYN) tablet 500 mg  500 mg Oral BID PRN White, Patrice L, NP       ondansetron (ZOFRAN-ODT) disintegrating tablet 4 mg  4 mg Oral Q6H PRN White, Patrice L, NP       thiamine (VITAMIN B1) tablet 100 mg  100 mg Oral Daily White, Patrice L, NP   100 mg at 10/23/22 1036  traZODone (DESYREL) tablet 50 mg  50 mg Oral QHS PRN White, Patrice L, NP   50 mg at 10/22/22 2154   Current Outpatient Medications  Medication Sig Dispense Refill   Rivaroxaban 15 & 20 MG TBPK Take as directed on package: Start with one 15mg  tablet by mouth twice a day with food. On Day 22, switch to one 20mg  tablet once a day with food. (Patient not taking: Reported on 10/21/2022) 51 each 0    Labs  Lab Results:  Admission on 10/20/2022  Component Date Value Ref Range Status   WBC 10/20/2022 5.6  4.0 - 10.5 K/uL Final   RBC 10/20/2022 4.98  4.22 - 5.81 MIL/uL Final   Hemoglobin 10/20/2022 13.0  13.0 - 17.0 g/dL Final   HCT 44/05/270 40.3  39.0 - 52.0 % Final   MCV 10/20/2022 80.9  80.0 - 100.0 fL Final   MCH 10/20/2022 26.1  26.0 - 34.0 pg Final   MCHC 10/20/2022 32.3  30.0 - 36.0 g/dL Final    RDW 53/66/4403 12.9  11.5 - 15.5 % Final   Platelets 10/20/2022 348  150 - 400 K/uL Final   nRBC 10/20/2022 0.0  0.0 - 0.2 % Final   Neutrophils Relative % 10/20/2022 62  % Final   Neutro Abs 10/20/2022 3.4  1.7 - 7.7 K/uL Final   Lymphocytes Relative 10/20/2022 31  % Final   Lymphs Abs 10/20/2022 1.7  0.7 - 4.0 K/uL Final   Monocytes Relative 10/20/2022 5  % Final   Monocytes Absolute 10/20/2022 0.3  0.1 - 1.0 K/uL Final   Eosinophils Relative 10/20/2022 2  % Final   Eosinophils Absolute 10/20/2022 0.1  0.0 - 0.5 K/uL Final   Basophils Relative 10/20/2022 0  % Final   Basophils Absolute 10/20/2022 0.0  0.0 - 0.1 K/uL Final   Immature Granulocytes 10/20/2022 0  % Final   Abs Immature Granulocytes 10/20/2022 0.02  0.00 - 0.07 K/uL Final   Performed at [facility], [address], [place], [place] [address]   Sodium 10/20/2022 134 (L)  135 - 145 mmol/L Final   Potassium 10/20/2022 3.8  3.5 - 5.1 mmol/L Final   Chloride 10/20/2022 99  98 - 111 mmol/L Final   CO2 10/20/2022 26  22 - 32 mmol/L Final   Glucose, Bld 10/20/2022 104 (H)  70 - 99 mg/dL Final   Glucose reference range applies only to samples taken after fasting for at least 8 hours.   BUN 10/20/2022 8  6 - 20 mg/dL Final   Creatinine, Ser 10/20/2022 1.04  0.61 - 1.24 mg/dL Final   Calcium 95/63/8756 9.1  8.9 - 10.3 mg/dL Final   Total Protein 43/32/9518 7.5  6.5 - 8.1 g/dL Final   Albumin 84/16/6063 4.0  3.5 - 5.0 g/dL Final   AST 01/60/1093 20  15 - 41 U/L Final   ALT 10/20/2022 19  0 - 44 U/L Final   Alkaline Phosphatase 10/20/2022 68  38 - 126 U/L Final   Total Bilirubin 10/20/2022 1.1  0.3 - 1.2 mg/dL Final   GFR, Estimated 10/20/2022 >60  >60 mL/min Final   Comment: (NOTE) Calculated using the CKD-EPI Creatinine Equation (2021)    Anion gap 10/20/2022 9  5 - 15 Final   Performed at [facility], [address] [place], [place] [address]   Hgb A1c MFr Bld 10/20/2022 5.3  4.8 - 5.6 % Final   Comment:  (NOTE) Pre diabetes:  5.7%-6.4%  Diabetes:              >6.4%  Glycemic control for   <7.0% adults with diabetes    Mean Plasma Glucose 10/20/2022 105.41  mg/dL Final   Performed at [facility], [address] [place], [place] [address]   Alcohol, Ethyl (B) 10/20/2022 <10  <10 mg/dL Final   Comment: (NOTE) Lowest detectable limit for serum alcohol is 10 mg/dL.  For medical purposes only. Performed at [facility], [address], [place], [place] [address]    Cholesterol 10/20/2022 217 (H)  0 - 200 mg/dL Final   Triglycerides 41/32/4401 50  <150 mg/dL Final   HDL 02/72/5366 67  >40 mg/dL Final   Total CHOL/HDL Ratio 10/20/2022 3.2  RATIO Final   VLDL 10/20/2022 10  0 - 40 mg/dL Final   LDL Cholesterol 10/20/2022 140 (H)  0 - 99 mg/dL Final   Comment:        Total Cholesterol/HDL:CHD Risk Coronary Heart Disease Risk Table                     Men   Women  1/2 Average Risk   3.4   3.3  Average Risk       5.0   4.4  2 X Average Risk   9.6   7.1  3 X Average Risk  23.4   11.0        Use the calculated Patient Ratio above and the CHD Risk Table to determine the patient's CHD Risk.        ATP III CLASSIFICATION (LDL):  <100     mg/dL   Optimal  440-347  mg/dL   Near or Above                    Optimal  130-159  mg/dL   Borderline  425-956  mg/dL   High  >387     mg/dL   Very High Performed at [facility], [address], [place], [place] [address]    TSH 10/20/2022 0.879  0.350 - 4.500 uIU/mL Final   Comment: Performed by a 3rd Generation assay with a functional sensitivity of <=0.01 uIU/mL. Performed at [facility], [address] [place], [place] [address]     Blood Alcohol level:  Lab Results  Component Value Date   ETH <10 10/20/2022    Metabolic Disorder Labs: Lab Results  Component Value Date   HGBA1C 5.3 10/20/2022   MPG 105.41 10/20/2022   No results found for: "PROLACTIN" Lab Results  Component Value Date   CHOL  217 (H) 10/20/2022   TRIG 50 10/20/2022   HDL 67 10/20/2022   CHOLHDL 3.2 10/20/2022   VLDL 10 10/20/2022   LDLCALC 140 (H) 10/20/2022    Therapeutic Lab Levels: No results found for: "LITHIUM" No results found for: "VALPROATE" No results found for: "CBMZ"  Physical Findings   PHQ2-9    Flowsheet Row ED from 10/20/2022 in [facility]  PHQ-2 Total Score 1  PHQ-9 Total Score 2      Flowsheet Row ED from 10/20/2022 in [facility] Most recent reading at 10/20/2022  4:48 PM ED [facility] 10[facility]20/20[facility] in [facility] Most recent reading at 10/20/2022  4:03 PM ED from 08/28/2021 in [facility] at [facility] Most recent reading at 08/28/2021  3:33 PM  C-SSRS  RISK CATEGORY No Risk No Risk No Risk        Musculoskeletal  Strength & Muscle Tone: within normal limits Gait & Station: normal Patient leans: N/A  Psychiatric Specialty Exam  Presentation  General Appearance:  Appropriate for Environment  Eye Contact: Fair  Speech: Clear and Coherent  Speech Volume: Normal  Handedness: Right   Mood and Affect  Mood: Depressed  Affect: Congruent   Thought Process  Thought Processes: Coherent; Goal Directed  Descriptions of Associations:Intact  Orientation:Full (Time, Place and Person)  Thought Content:Logical     Hallucinations:No data recorded Ideas of Reference:None  Suicidal Thoughts:No data recorded Homicidal Thoughts:No data recorded  Sensorium  Memory: Immediate Fair; Recent Fair; Remote Fair  Judgment: Intact  Insight: Present   Executive Functions  Concentration: Fair  Attention Span: Fair  Recall: [employer] of Knowledge: Fair  Language: Fair   Psychomotor Activity  Psychomotor Activity:No data recorded  Assets  Assets: Communication Skills; Desire for Improvement; Leisure Time; Physical Health   Sleep  Sleep:No  data recorded  No data recorded  Physical Exam  Physical Exam ROS Blood pressure 117/79, pulse 79, temperature 97.7 F (36.5 C), temperature source Oral, resp. rate 16, SpO2 98 %. There is no height or weight on file to calculate BMI.  Treatment Plan Summary: Daily contact with patient to assess and evaluate symptoms and progress in treatment and Medication management  Continue with current treatment plan on 10/23/2022 as listed below except were noted   Cocaine abuse: Substance-induced mood disorder:   Continue Ativan/CIWA protocol -Initiated Colace 100 mg for reported constipation (resolved) order was discontinued  -Liquid milk of magnesia    Patient to continue medications as indicated Patient encouraged to participate in therapeutic milieu CSW to continue working on discharge disposition  Oneta Rack, NP 10/23/2022 12:34 PM

## 2022-10-24 DIAGNOSIS — F1994 Other psychoactive substance use, unspecified with psychoactive substance-induced mood disorder: Secondary | ICD-10-CM | POA: Diagnosis present

## 2022-10-24 DIAGNOSIS — F1113 Opioid abuse with withdrawal: Secondary | ICD-10-CM | POA: Diagnosis not present

## 2022-10-24 DIAGNOSIS — F1410 Cocaine abuse, uncomplicated: Secondary | ICD-10-CM | POA: Diagnosis present

## 2022-10-24 DIAGNOSIS — F4310 Post-traumatic stress disorder, unspecified: Secondary | ICD-10-CM | POA: Diagnosis present

## 2022-10-24 DIAGNOSIS — F1190 Opioid use, unspecified, uncomplicated: Secondary | ICD-10-CM | POA: Diagnosis present

## 2022-10-24 MED ORDER — HYDROXYZINE HCL 25 MG PO TABS: 25.0000 mg | ORAL_TABLET | Freq: Three times a day (TID) | ORAL | 0 refills | Status: DC | PRN

## 2022-10-24 MED ORDER — TRAZODONE HCL 50 MG PO TABS: 50.0000 mg | ORAL_TABLET | Freq: Every evening | ORAL | 0 refills | Status: DC | PRN

## 2022-10-24 NOTE — Discharge Planning (Signed)
LCSW was advised by staff that patient is requesting to speak with LCSW. LCSW went and spoke with patient at bedside. Patient reports he has a job that he is able to start ASAP and he would like to discharge on today. Patient asked if LCSW had housing options for the patient, and was informed that LCSW could provide a list of the local shelters and [facility] within the area. Patient reports he would like a list of the [facility] stating he would be able to pay for a room. LCSW explored if patient was no longer interested in residential placement at this time as he was still being considered at [facility] and [facility]. Patient reports he is not interested in placement at this time as he wants to get back to work and get back to his wife. Patient aware that he would need to follow up with the [facility] in the area regarding availability. Patient reports even if he is unable to secure something on today, he would still like to be discharged. Patient aware that update will be provided to MD and he will be assessed. Additional outpatient resources have been added to his AVS for his follow up.   Fernande Boyden, LCSW Clinical Social Worker [facility] Ph: [phone]

## 2022-10-24 NOTE — ED Notes (Signed)
Pt is currently sleeping, no distress noted, environmental check complete, will continue to monitor patient for safety.  

## 2022-10-24 NOTE — Discharge Instructions (Addendum)
[facility] [address] [place], , [address] [phone] phone  New Patient Assessment/Therapy Walk-Ins:  Monday and Wednesday: 8 am until slots are full. Every 1st and 2nd Fridays of the month: 1 pm - 5 pm.  NO ASSESSMENT/THERAPY WALK-INS ON TUESDAYS OR THURSDAYS  New Patient Assessment/Medication Management Walk-Ins:  Monday - Friday:  8 am - 11 am.  For all walk-ins, we ask that you arrive by 7:30 am because patients will be seen in the order of arrival.  Availability is limited; therefore, you may not be seen on the same day that you walk-in.  Our goal is to serve and meet the needs of our community to the best of our ability.  SUBSTANCE USE TREATMENT for Medicaid and State Funded/IPRS  [facility] [address] [place], [place], [address] [phone] phone NOTE: [facility] is no longer offering IOP services.  Serves those who are low-income or have no insurance.  [facility] [address], [place], [place], [address] [phone] phone [phone] fax NOTE: Does have Substance Abuse-Intensive Outpatient Program (SAIOP) as well as transitional housing if eligible.  [facility] [address] [place], [place], [address] [phone] phone [phone] fax  [facility] [address] [place], [place], [address] [phone] phone [phone] fax  HALFWAY HOUSES:  [facility] [phone]  [facility] www.oxfordvacancies.com  12 STEP PROGRAMS:  Alcoholics Anonymous of [place] https://aagreensboronc.com/meeting  Narcotics Anonymous of [place] https://greensborona.org/meetings/  Al-Anon of [place], [place] www.greens[place]alanon.org/find-meetings.html  Nar-Anon https://nar-anon.org/find-a-meetin  List of Residential placements:   [facility] in [place]: [phone]  [facility]: [phone]  [facility]: [place], [place]  [phone]: Male and male facility; 30-day program: (uninsured and Medicaid such as [facility], [facility], [facility], partners)  [facility]: [phone]; men and women's facility; 28 days; Can have Medicaid tailored plan ([facility] or [facility])  [facility]: [phone] [place] or [place]; 28 day program; must be fully detox; tailored Medicaid or no insurance  [facility] in [place], [place]; [phone]; 28 day all males program; no insurance accepted  [facility] in [place]: [place] [phone] (no insurance or Medicaid only); 90 days; outpatient services but provide housing in apartments [place]  [facility] Admission: [phone]: Patient must complete phone screening for placement: [place], [place]; 6 month program; uninsured, Medicaid, and [facility] insurance.   [facility]: no insurance required; [phone]  [place] [facility]: [phone]; Intake: Robert; Must fill out application online; Victor Delay [phone] x [phone]  [facility] in [place], [place]: [phone]; Admissions Coordinators Mr. Dennis or David Gibson; 90 day program.  [facility]: [place], [place] [phone]; Co-Ed 9 month to a year program; Online application; Men entry fee is $500 (6-12months);  [facility]: [address] [place], [place] [address]; no fee or insurance required; minimum of 2 years; Highly structured; work based; Intake Coordinator is Chris [phone]  [facility] in [place], [place]: [phone]; Fax number is [phone]; website: [facility].org; Requires 3-6 page autobiography; 2 year program (18 months and then 6month transitional housing); Admission fee is $300; no insurance needed; work program  [facility] in [place], : Front Desk Staff: Reeci [phone]: They have a Men's Regenerations Program 6-9months. Free program; There is an initial $300 fee however, they are willing to work  with patients regarding that. Application is online.  [facility]: Admissions [phone] Benjamin Cox ext [phone]; Any 7-90 day program is out of pocket; 12   month program is free of charge; there is a $275 entry fee; Patient is responsible for own transportation 

## 2022-10-24 NOTE — ED Notes (Signed)
Pt was provided with breakfast.

## 2022-10-24 NOTE — ED Provider Notes (Cosign Needed Addendum)
FBC/OBS ASAP Discharge Summary  Date and Time: 10/24/2022 10:22 AM  Name: Edward Barr  MRN:  [id number]   Discharge Diagnoses:  Final diagnoses:  Cocaine abuse (HCC)  Opiate use  Substance induced mood disorder (HCC)  PTSD (post-traumatic stress disorder)    Subjective:  Edward Barr is a 52 year old male with no past psychiatric history who presented to the [facility] behavioral health urgent care voluntarily seeking substance abuse treatment and detox, for which he was admitted to [facility].     Stay Summary: The patient was evaluated each day by a clinical provider to ascertain response to treatment. Improvement was noted by the patient's report of decreasing symptoms, improved sleep and appetite, affect, medication tolerance, behavior, and participation in unit programming.  Patient was asked each day to complete a self inventory noting mood, mental status, pain, new symptoms, anxiety and concerns.   Patient responded well to medication and being in a therapeutic and supportive environment. Positive and appropriate behavior was noted and the patient was motivated for recovery. The patient worked closely with the treatment team and case manager to develop a discharge plan with appropriate goals. Coping skills, problem solving as well as relaxation therapies were also part of the unit programming.   By the day of discharge patient was in much improved condition than upon admission.  Symptoms were reported as significantly decreased or resolved completely. The patient denied SI/HI and voiced no AVH. The patient was motivated to continue taking medication with a goal of continued improvement in mental health.    Patient reports acceptance to an [facility] on Wed, 6/26 and starting a new job on 6/25. He has the plan to return to his wife's home in the interim, as she does not use drugs nor alcohol and does not enable him to use. Patient is motivated for change.  Total Time spent with patient:  30 minutes  Past Psychiatric History: Denies; saw a therapist briefly when released from prison in 2000 Past Medical History: Denies Family History:       Family History  Problem Relation Age of Onset   Diabetes Mother     Heart Problems Mother     Heart Problems Father      Family Psychiatric  History: Father history of alcoholism.  Social History: He states that he is currently separated from his fiance Candace [phone]) [phone] due to his drug use. He states that he has been living at a motel for the past 2 weeks. He denies current medical problems. He denies taking prescribed or over-the-counter medications at this time. He states that he has 9 children ages 49, 47, 38, 27, 40, 33 69, 60, and 74. He reports having 32 grandkids.  Tobacco Cessation:  N/A, patient does not currently use tobacco products  Current Medications:  Current Facility-Administered Medications  Medication Dose Route Frequency Provider Last Rate Last Admin   acetaminophen (TYLENOL) tablet 650 mg  650 mg Oral Q6H PRN White, Patrice L, NP       alum & mag hydroxide-simeth (MAALOX/MYLANTA) 200-200-20 MG/5ML suspension 30 mL  30 mL Oral Q4H PRN White, Patrice L, NP   30 mL at 10/23/22 1036   dicyclomine (BENTYL) tablet 20 mg  20 mg Oral Q6H PRN White, Patrice L, NP   20 mg at 10/20/22 2111   hydrOXYzine (ATARAX) tablet 25 mg  25 mg Oral TID PRN White, Patrice L, NP   25 mg at 10/22/22 2153   loperamide (IMODIUM) capsule 2-4 mg  2-4 mg Oral PRN White, Patrice L, NP       magnesium hydroxide (MILK OF MAGNESIA) suspension 30 mL  30 mL Oral Daily PRN White, Patrice L, NP   30 mL at 10/22/22 0855   methocarbamol (ROBAXIN) tablet 500 mg  500 mg Oral Q8H PRN White, Patrice L, NP       multivitamin with minerals tablet 1 tablet  1 tablet Oral Daily White, Patrice L, NP   1 tablet at 10/24/22 0939   naproxen (NAPROSYN) tablet 500 mg  500 mg Oral BID PRN White, Patrice L, NP       thiamine (VITAMIN B1) tablet 100 mg  100 mg Oral  Daily White, Patrice L, NP   100 mg at 10/24/22 0939   traZODone (DESYREL) tablet 50 mg  50 mg Oral QHS PRN White, Patrice L, NP   50 mg at 10/22/22 2154   Current Outpatient Medications  Medication Sig Dispense Refill   hydrOXYzine (ATARAX) 25 MG tablet Take 1 tablet (25 mg total) by mouth 3 (three) times daily as needed for anxiety. 30 tablet 0   traZODone (DESYREL) 50 MG tablet Take 1 tablet (50 mg total) by mouth at bedtime as needed for sleep. 30 tablet 0    PTA Medications:  Facility Ordered Medications  Medication   acetaminophen (TYLENOL) tablet 650 mg   alum & mag hydroxide-simeth (MAALOX/MYLANTA) 200-200-20 MG/5ML suspension 30 mL   magnesium hydroxide (MILK OF MAGNESIA) suspension 30 mL   hydrOXYzine (ATARAX) tablet 25 mg   traZODone (DESYREL) tablet 50 mg   dicyclomine (BENTYL) tablet 20 mg   loperamide (IMODIUM) capsule 2-4 mg   methocarbamol (ROBAXIN) tablet 500 mg   naproxen (NAPROSYN) tablet 500 mg   [COMPLETED] thiamine (VITAMIN B1) injection 100 mg   thiamine (VITAMIN B1) tablet 100 mg   multivitamin with minerals tablet 1 tablet   [EXPIRED] LORazepam (ATIVAN) tablet 1 mg   [EXPIRED] ondansetron (ZOFRAN-ODT) disintegrating tablet 4 mg   [COMPLETED] docusate sodium (COLACE) capsule 100 mg   PTA Medications  Medication Sig   hydrOXYzine (ATARAX) 25 MG tablet Take 1 tablet (25 mg total) by mouth 3 (three) times daily as needed for anxiety.   traZODone (DESYREL) 50 MG tablet Take 1 tablet (50 mg total) by mouth at bedtime as needed for sleep.       10/23/2022   12:33 PM 10/20/2022    6:48 PM  Depression screen PHQ 2/9  Decreased Interest 0 3  Down, Depressed, Hopeless 1 2  PHQ - 2 Score 1 5  Altered sleeping 0 1  Tired, decreased energy 0 2  Change in appetite 0 0  Feeling bad or failure about yourself  0 2  Trouble concentrating 0 0  Moving slowly or fidgety/restless 0 0  Suicidal thoughts 1 0  PHQ-9 Score 2 10  Difficult doing work/chores  Somewhat  difficult    Flowsheet Row ED from 10/20/2022 in [facility] Most recent reading at 10/20/2022  4:48 PM ED from 10/20/2022 in [facility] Most recent reading at 10/20/2022  4:03 PM ED from 08/28/2021 in [facility] Emergency Department at [facility] Most recent reading at 08/28/2021  3:33 PM  C-SSRS RISK CATEGORY No Risk No Risk No Risk       Musculoskeletal  Strength & Muscle Tone: within normal limits Gait & Station: normal Patient leans: N/A  Psychiatric Specialty Exam  Presentation  General Appearance:  Appropriate for Environment; Casual  Eye Contact: Good  Speech: Clear and Coherent; Normal Rate  Speech Volume: Normal  Handedness: Right   Mood and Affect  Mood: Euthymic  Affect: Appropriate; Congruent   Thought Process  Thought Processes: Coherent; Linear  Descriptions of Associations:Intact  Orientation:Full (Time, Place and Person)  Thought Content:Logical; WDL     Hallucinations:Hallucinations: None  Ideas of Reference:None  Suicidal Thoughts:Suicidal Thoughts: No  Homicidal Thoughts:Homicidal Thoughts: No   Sensorium  Memory: Immediate Good; Recent Good  Judgment: Good  Insight: Good   Executive Functions  Concentration: Good  Attention Span: Good  Recall: Good  Fund of Knowledge: Good  Language: Good   Psychomotor Activity  Psychomotor Activity: Psychomotor Activity: Normal   Assets  Assets: Communication Skills; Desire for Improvement; Social Support; Intimacy; Resilience; Vocational/Educational   Sleep  Sleep: Sleep: Fair   Nutritional Assessment (For OBS and FBC admissions only) Has the patient had a weight loss or gain of 10 pounds or more in the last 3 months?: No Has the patient had a decrease in food intake/or appetite?: No Does the patient have dental problems?: No Does the patient have eating habits or behaviors that may be  indicators of an eating disorder including binging or inducing vomiting?: No Has the patient recently lost weight without trying?: 0 Has the patient been eating poorly because of a decreased appetite?: 0 Malnutrition Screening Tool Score: 0    Physical Exam  Physical Exam Vitals reviewed.  Constitutional:      General: He is not in acute distress.    Appearance: He is not toxic-appearing.  HENT:     Head: Normocephalic and atraumatic.     Mouth/Throat:     Mouth: Mucous membranes are moist.     Pharynx: Oropharynx is clear.  Pulmonary:     Effort: Pulmonary effort is normal.  Skin:    General: Skin is warm and dry.  Neurological:     General: No focal deficit present.     Mental Status: He is alert and oriented to person, place, and time. Mental status is at baseline.     Motor: No weakness.     Gait: Gait normal.    Review of Systems  Cardiovascular:  Negative for chest pain.  Gastrointestinal: Negative.   Genitourinary: Negative.   Neurological:  Negative for dizziness, tremors, seizures and headaches.   Blood pressure 98/60, pulse 71, temperature 97.9 F (36.6 C), temperature source Tympanic, resp. rate 16, SpO2 100 %. There is no height or weight on file to calculate BMI.  Demographic Factors:  Male, Low socioeconomic status, and Unemployed  Loss Factors: Loss of significant relationship and Financial problems/change in socioeconomic status  Historical Factors: Family history of mental illness or substance abuse  Risk Reduction Factors:   Responsible for children under 56 years of age, Sense of responsibility to family, and Positive social support  Continued Clinical Symptoms:  Alcohol/Substance Abuse/Dependencies  Cognitive Features That Contribute To Risk:  None    Suicide Risk:  Mild: There are no identifiable plans, no associated intent, mild dysphoria and related symptoms, good self-control (both objective and subjective assessment), few other risk  factors, and identifiable protective factors, including available and accessible social support.  Plan Of Care/Follow-up recommendations:  Follow-up recommendations:  Activity:  Normal, as tolerated Diet:  Per PCP recommendation  Patient is instructed prior to discharge to: Take all medications as prescribed by his mental healthcare provider. Report any adverse effects and/or reactions from the medicines to his outpatient provider promptly. Patient  has been instructed & cautioned: To not engage in alcohol and or illegal drug use while on prescription medicines.  In the event of worsening symptoms, patient is instructed to call the crisis hotline at 988, 911 and or go to the nearest ED for appropriate evaluation and treatment of symptoms. To follow-up with his primary care provider for your other medical issues, concerns and or health care needs.   Disposition: Home with wife, [place] on 6/26. Resources for therapy in AVS.  Lamar Sprinkles, MD 10/24/2022, 10:22 AM

## 2022-10-24 NOTE — ED Notes (Signed)
Patient was discharged to home today. Patient denies SI/HI and AVH. Patient received a bus pass for transportation. Patient received his AVS with community resources.

## 2022-10-28 ENCOUNTER — Encounter (HOSPITAL_COMMUNITY): Payer: Self-pay

## 2022-10-28 ENCOUNTER — Emergency Department (HOSPITAL_COMMUNITY)
Admission: EM | Admit: 2022-10-28 | Discharge: 2022-10-28 | Disposition: A | Discharge status: Home / Self Care | Payer: Self-pay | Attending: Emergency Medicine | Admitting: Emergency Medicine

## 2022-10-28 ENCOUNTER — Emergency Department (HOSPITAL_COMMUNITY): Payer: Self-pay

## 2022-10-28 ENCOUNTER — Other Ambulatory Visit: Payer: Self-pay

## 2022-10-28 DIAGNOSIS — R110 Nausea: Secondary | ICD-10-CM | POA: Insufficient documentation

## 2022-10-28 DIAGNOSIS — R079 Chest pain, unspecified: Secondary | ICD-10-CM | POA: Insufficient documentation

## 2022-10-28 DIAGNOSIS — I2510 Atherosclerotic heart disease of native coronary artery without angina pectoris: Secondary | ICD-10-CM | POA: Insufficient documentation

## 2022-10-28 LAB — CBC WITH DIFFERENTIAL/PLATELET
Abs Immature Granulocytes: 0.01 10*3/uL (ref 0.00–0.07)
Basophils Absolute: 0 10*3/uL (ref 0.0–0.1)
Basophils Relative: 1 %
Eosinophils Absolute: 0.2 10*3/uL (ref 0.0–0.5)
Eosinophils Relative: 6 %
HCT: 35.9 % — ABNORMAL LOW (ref 39.0–52.0)
Hemoglobin: 11.3 g/dL — ABNORMAL LOW (ref 13.0–17.0)
Immature Granulocytes: 0 %
Lymphocytes Relative: 42 %
Lymphs Abs: 1.7 10*3/uL (ref 0.7–4.0)
MCH: 25.7 pg — ABNORMAL LOW (ref 26.0–34.0)
MCHC: 31.5 g/dL (ref 30.0–36.0)
MCV: 81.8 fL (ref 80.0–100.0)
Monocytes Absolute: 0.2 10*3/uL (ref 0.1–1.0)
Monocytes Relative: 6 %
Neutro Abs: 1.9 10*3/uL (ref 1.7–7.7)
Neutrophils Relative %: 45 %
Platelets: 254 10*3/uL (ref 150–400)
RBC: 4.39 MIL/uL (ref 4.22–5.81)
RDW: 13.1 % (ref 11.5–15.5)
WBC: 4.2 10*3/uL (ref 4.0–10.5)
nRBC: 0 % (ref 0.0–0.2)

## 2022-10-28 LAB — COMPREHENSIVE METABOLIC PANEL
ALT: 29 U/L (ref 0–44)
AST: 28 U/L (ref 15–41)
Albumin: 3.6 g/dL (ref 3.5–5.0)
Alkaline Phosphatase: 55 U/L (ref 38–126)
Anion gap: 8 (ref 5–15)
BUN: 21 mg/dL — ABNORMAL HIGH (ref 6–20)
CO2: 24 mmol/L (ref 22–32)
Calcium: 8.6 mg/dL — ABNORMAL LOW (ref 8.9–10.3)
Chloride: 107 mmol/L (ref 98–111)
Creatinine, Ser: 0.78 mg/dL (ref 0.61–1.24)
GFR, Estimated: >60 mL/min (ref >60)
Glucose, Bld: 96 mg/dL (ref 70–99)
Potassium: 3.5 mmol/L (ref 3.5–5.1)
Sodium: 139 mmol/L (ref 135–145)
Total Bilirubin: 1 mg/dL (ref 0.3–1.2)
Total Protein: 6.6 g/dL (ref 6.5–8.1)

## 2022-10-28 LAB — TROPONIN I (HIGH SENSITIVITY)
Troponin I (High Sensitivity): 10 ng/L (ref <18)
Troponin I (High Sensitivity): 11 ng/L (ref <18)

## 2022-10-28 LAB — LIPASE, BLOOD: Lipase: 43 U/L (ref 11–51)

## 2022-10-28 MED ORDER — KETOROLAC TROMETHAMINE 30 MG/ML IJ SOLN
30.0000 mg | Freq: Once | INTRAMUSCULAR | Status: AC
  Administered 2022-10-28: 30 mg via INTRAVENOUS
  Filled 2022-10-28: qty 1

## 2022-10-28 NOTE — ED Provider Notes (Signed)
[place] EMERGENCY DEPARTMENT AT [facility] Provider Note   CSN: [id number] Arrival date & time: 10/28/22  0849     History  Chief Complaint  Patient presents with   Chest Pain    Tyler Blech is a 52 y.o. male.  HPI   52 year old male with past medical history of CAD presents emergency department left-sided chest pain. Patient is a minimal historian. Patient states that this started this morning around 3 AM.  Woke him up from sleep.  He describes the pain as left-sided, sharp.  Does not radiate through to his back.  States that he has a mild upset stomach with nausea.  Denies any shortness of breath or swelling of his lower extremities.  Admits to polysubstance use, specifically cocaine.  Last use was 3 days ago.  Does not currently follow with cardiology.  Denies any recent fever.  States that the pain is worse with certain movements and pressing on the left side of the chest.  No complaint of rash.  Home Medications Prior to Admission medications   Medication Sig Start Date End Date Taking? Authorizing Provider  hydrOXYzine (ATARAX) 25 MG tablet Take 1 tablet (25 mg total) by mouth 3 (three) times daily as needed for anxiety. 10/24/22   Lamar Sprinkles, MD  traZODone (DESYREL) 50 MG tablet Take 1 tablet (50 mg total) by mouth at bedtime as needed for sleep. 10/24/22 11/23/22  Lamar Sprinkles, MD      Allergies    Pork-derived products    Review of Systems   Review of Systems  Constitutional:  Positive for fatigue. Negative for fever.  Respiratory:  Negative for chest tightness and shortness of breath.   Cardiovascular:  Positive for chest pain. Negative for palpitations and leg swelling.  Gastrointestinal:  Positive for nausea. Negative for abdominal pain, diarrhea and vomiting.  Musculoskeletal:  Negative for back pain and neck pain.  Skin:  Negative for rash.  Neurological:  Negative for headaches.    Physical Exam Updated Vital Signs BP (!) 144/80 (BP  Location: Left Arm)   Pulse 70   Temp 97.8 F (36.6 C) (Oral)   Resp 16   Ht 5\' 8"  (1.727 m)   Wt 71.7 kg   SpO2 99%   BMI 24.03 kg/m  Physical Exam Vitals and nursing note reviewed.  Constitutional:      General: He is not in acute distress.    Appearance: Normal appearance.  HENT:     Head: Normocephalic.     Mouth/Throat:     Mouth: Mucous membranes are moist.  Cardiovascular:     Rate and Rhythm: Normal rate.  Pulmonary:     Effort: Pulmonary effort is normal. No respiratory distress.     Breath sounds: No decreased breath sounds.  Chest:     Comments: Reproducible tenderness to palpation in the left pec and along the left costochondral joint.  No overlying rash or skin changes Abdominal:     Palpations: Abdomen is soft.     Tenderness: There is no abdominal tenderness.  Musculoskeletal:     Right lower leg: No edema.     Left lower leg: No edema.  Skin:    General: Skin is warm.  Neurological:     Mental Status: He is alert and oriented to person, place, and time. Mental status is at baseline.  Psychiatric:        Mood and Affect: Mood normal.     ED Results / Procedures / Treatments  Labs (all labs ordered are listed, but only abnormal results are displayed) Labs Reviewed  CBC WITH DIFFERENTIAL/PLATELET  COMPREHENSIVE METABOLIC PANEL  LIPASE, BLOOD  TROPONIN I (HIGH SENSITIVITY)    EKG EKG Interpretation Date/Time:  Friday October 28 2022 08:57:51 EDT Ventricular Rate:  73 PR Interval:  191 QRS Duration:  77 QT Interval:  383 QTC Calculation: 422 R Axis:   93  Text Interpretation: Sinus rhythm Anterior infarct, old Similar to previous Confirmed by Coralee Pesa [phone]) on 10/28/2022 9:16:07 AM  Radiology DG Chest Port 1 View  Result Date: 10/28/2022 CLINICAL DATA:  Chest pain EXAM: PORTABLE CHEST 1 VIEW COMPARISON:  X-ray 04/17/2021 FINDINGS: No consolidation, pneumothorax or effusion. No edema. Normal cardiopericardial silhouette. Overlapping  cardiac leads. IMPRESSION: No acute cardiopulmonary disease Electronically Signed   By: Karen Kays M.D.   On: 10/28/2022 09:50    Procedures Procedures    Medications Ordered in ED Medications  ketorolac (TORADOL) 30 MG/ML injection 30 mg (has no administration in time range)    ED Course/ Medical Decision Making/ A&P                             Medical Decision Making Amount and/or Complexity of Data Reviewed Labs: ordered. Radiology: ordered.  Risk Prescription drug management.   52 year old male presents emergency department left-sided chest pain.  Started this morning around 4 AM.  Has been persistent and is present on arrival.  Vitals are normal and stable.  EKG is unchanged.  He has left-sided chest pain that is reproducible with palpation of the pec and left costochondral joint.  No overlying skin changes/rash.  Chest x-ray shows no acute change.  Blood work is reassuring with a negative troponin with no significant delta.  After medication patient feels improved.  Chest pain is still mildly reproducible.  I believe this is most likely musculoskeletal, low suspicion for ACS/PE.  Patient at this time appears safe and stable for discharge and close outpatient follow up. Discharge plan and strict return to ED precautions discussed, patient verbalizes understanding and agreement.        Final Clinical Impression(s) / ED Diagnoses Final diagnoses:  None    Rx / DC Orders ED Discharge Orders     None         Rozelle Logan, DO 10/28/22 1314

## 2022-10-28 NOTE — ED Triage Notes (Signed)
Pt arrived with complaints of cp, non radiating that started this morning. States "mini HA" many years ago. Denies taking any meds. Denies any other symptoms. States uses cocaine, last use 3 days ago. Inquires about detox., NAD noted in triage, MAE, resp even and unlabored

## 2022-10-28 NOTE — Discharge Instructions (Signed)
You have been seen and discharged from the emergency department.  Your heart and lung workup were normal.  I believe your chest pain could be musculoskeletal.  You may treat it with Tylenol and Ibuprofen.  Follow-up with your primary provider for further evaluation and further care. Take home medications as prescribed. If you have any worsening symptoms or further concerns for your health please return to an emergency department for further evaluation.

## 2022-12-09 ENCOUNTER — Emergency Department (HOSPITAL_COMMUNITY): Payer: Self-pay

## 2022-12-09 ENCOUNTER — Encounter (HOSPITAL_COMMUNITY): Payer: Self-pay

## 2022-12-09 ENCOUNTER — Other Ambulatory Visit: Payer: Self-pay

## 2022-12-09 ENCOUNTER — Emergency Department (HOSPITAL_COMMUNITY)
Admission: EM | Admit: 2022-12-09 | Discharge: 2022-12-10 | Disposition: A | Discharge status: Home / Self Care | Payer: Self-pay | Attending: Emergency Medicine | Admitting: Emergency Medicine

## 2022-12-09 DIAGNOSIS — I10 Essential (primary) hypertension: Secondary | ICD-10-CM | POA: Insufficient documentation

## 2022-12-09 DIAGNOSIS — M542 Cervicalgia: Secondary | ICD-10-CM | POA: Insufficient documentation

## 2022-12-09 DIAGNOSIS — U071 COVID-19: Secondary | ICD-10-CM | POA: Insufficient documentation

## 2022-12-09 DIAGNOSIS — R55 Syncope and collapse: Secondary | ICD-10-CM | POA: Insufficient documentation

## 2022-12-09 LAB — CBC WITH DIFFERENTIAL/PLATELET
Abs Immature Granulocytes: 0.01 10*3/uL (ref 0.00–0.07)
Basophils Absolute: 0 10*3/uL (ref 0.0–0.1)
Basophils Relative: 0 %
Eosinophils Absolute: 0 10*3/uL (ref 0.0–0.5)
Eosinophils Relative: 1 %
HCT: 40.2 % (ref 39.0–52.0)
Hemoglobin: 12.6 g/dL — ABNORMAL LOW (ref 13.0–17.0)
Immature Granulocytes: 0 %
Lymphocytes Relative: 17 %
Lymphs Abs: 0.6 10*3/uL — ABNORMAL LOW (ref 0.7–4.0)
MCH: 26 pg (ref 26.0–34.0)
MCHC: 31.3 g/dL (ref 30.0–36.0)
MCV: 82.9 fL (ref 80.0–100.0)
Monocytes Absolute: 0.4 10*3/uL (ref 0.1–1.0)
Monocytes Relative: 13 %
Neutro Abs: 2.3 10*3/uL (ref 1.7–7.7)
Neutrophils Relative %: 69 %
Platelets: 306 10*3/uL (ref 150–400)
RBC: 4.85 MIL/uL (ref 4.22–5.81)
RDW: 13.4 % (ref 11.5–15.5)
WBC: 3.3 10*3/uL — ABNORMAL LOW (ref 4.0–10.5)
nRBC: 0 % (ref 0.0–0.2)

## 2022-12-09 LAB — COMPREHENSIVE METABOLIC PANEL
ALT: 18 U/L (ref 0–44)
AST: 19 U/L (ref 15–41)
Albumin: 4.1 g/dL (ref 3.5–5.0)
Alkaline Phosphatase: 77 U/L (ref 38–126)
Anion gap: 11 (ref 5–15)
BUN: 10 mg/dL (ref 6–20)
CO2: 22 mmol/L (ref 22–32)
Calcium: 9 mg/dL (ref 8.9–10.3)
Chloride: 102 mmol/L (ref 98–111)
Creatinine, Ser: 0.95 mg/dL (ref 0.61–1.24)
GFR, Estimated: >60 mL/min (ref >60)
Glucose, Bld: 100 mg/dL — ABNORMAL HIGH (ref 70–99)
Potassium: 3.4 mmol/L — ABNORMAL LOW (ref 3.5–5.1)
Sodium: 135 mmol/L (ref 135–145)
Total Bilirubin: 0.4 mg/dL (ref 0.3–1.2)
Total Protein: 7.6 g/dL (ref 6.5–8.1)

## 2022-12-09 LAB — RESP PANEL BY RT-PCR (RSV, FLU A&B, COVID)  RVPGX2
Influenza A by PCR: NEGATIVE
Influenza B by PCR: NEGATIVE
Resp Syncytial Virus by PCR: NEGATIVE
SARS Coronavirus 2 by RT PCR: POSITIVE — AB

## 2022-12-09 LAB — RAPID HIV SCREEN (HIV 1/2 AB+AG)
HIV 1/2 Antibodies: NONREACTIVE
HIV-1 P24 Antigen - HIV24: NONREACTIVE

## 2022-12-09 LAB — D-DIMER, QUANTITATIVE: D-Dimer, Quant: <0.27 ug/mL-FEU (ref 0.00–0.50)

## 2022-12-09 LAB — TROPONIN I (HIGH SENSITIVITY)
Troponin I (High Sensitivity): 10 ng/L (ref <18)
Troponin I (High Sensitivity): 11 ng/L (ref <18)

## 2022-12-09 MED ORDER — ACETAMINOPHEN 500 MG PO TABS
1000.0000 mg | ORAL_TABLET | Freq: Once | ORAL | Status: AC
  Administered 2022-12-09: 1000 mg via ORAL
  Filled 2022-12-09: qty 2

## 2022-12-09 MED ORDER — LACTATED RINGERS IV BOLUS
1000.0000 mL | Freq: Once | INTRAVENOUS | Status: AC
  Administered 2022-12-09: 1000 mL via INTRAVENOUS

## 2022-12-09 NOTE — Discharge Instructions (Signed)
Thank you for coming to [facility] Emergency Department. You were seen for passing out, fever, flu-like symptoms. We did an exam, labs, and imaging, and these showed +covid infection. You can alternate taking Tylenol and ibuprofen as needed for pain. You can take 650mg  tylenol (acetaminophen) every 4-6 hours, and 600 mg ibuprofen 3 times a day. You can take mucinex and tea with honey for cough.  Please follow up with your primary care provider within 1 week. Attached are resources for social work/case management and local shelters.   Do not hesitate to return to the ED or call 911 if you experience: -Worsening symptoms -Chest pain, shortness of breath -Lightheadedness, passing out -Fevers/chills -Anything else that concerns you

## 2022-12-09 NOTE — ED Triage Notes (Signed)
Pt reports body aches, fever and cough since last night.

## 2022-12-09 NOTE — ED Provider Notes (Signed)
[place] EMERGENCY DEPARTMENT AT [facility] Provider Note   CSN: [id number] Arrival date & time: 12/09/22  1650     History {Add pertinent medical, surgical, social history, OB history to HPI:1} Chief Complaint  Patient presents with   Generalized Body Aches    Edward Barr is a 52 y.o. male with PMH as listed below who presents with body aches, fever and cough since last night.  He states he was at work today.  He was sitting and then stood up, felt very dizzy and lost consciousness.  He a lot of lightheadedness.  Denies any chest pain, palpitations.  Was not unconscious for very long and woke up with significant frontal headache and neck pain.  States it is painful to turn his head.  He also endorses mild shortness of breath.  Denies any history of DVT/PE, any leg swelling.  He has no history of syncope before this.  No history of MI.  Denies any recent drug use.  Past Medical History:  Diagnosis Date   DVT (deep venous thrombosis) (HCC) 2020   Hypertension        Home Medications Prior to Admission medications   Medication Sig Start Date End Date Taking? Authorizing Provider  hydrOXYzine (ATARAX) 25 MG tablet Take 1 tablet (25 mg total) by mouth 3 (three) times daily as needed for anxiety. 10/24/22   Lamar Sprinkles, MD  traZODone (DESYREL) 50 MG tablet Take 1 tablet (50 mg total) by mouth at bedtime as needed for sleep. 10/24/22 11/23/22  Lamar Sprinkles, MD      Allergies    Pork-derived products    Review of Systems   Review of Systems A 10 point review of systems was performed and is negative unless otherwise reported in HPI.  Physical Exam Updated Vital Signs BP 122/81 (BP Location: Right Arm)   Pulse 90   Temp (!) 102.4 F (39.1 C) (Oral)   Resp 18   Ht 5\' 8"  (1.727 m)   Wt 71.7 kg   SpO2 99%   BMI 24.03 kg/m  Physical Exam General: Normal appearing male, lying in bed.  HEENT: PERRLA, Sclera anicteric, MMM, trachea midline. NCAT.  Cardiology:  RRR, no murmurs/rubs/gallops. No chest wall TTP, no crepitus.  Resp: Normal respiratory rate and effort. CTAB, no wheezes, rhonchi, crackles.  Abd: Soft, non-tender, non-distended. No rebound tenderness or guarding.  Pelvis: Pelvis stable, nontender. MSK: No peripheral edema or signs of trauma. Extremities without deformity or TTP. No cyanosis or clubbing. Skin: warm, dry. No rashes or lesions. Back: +Midline C-spine TTP Neuro: A&Ox4, CNs II-XII grossly intact. MAEs. Sensation grossly intact.  Psych: Normal mood and affect.   ED Results / Procedures / Treatments   Labs (all labs ordered are listed, but only abnormal results are displayed) Labs Reviewed  RESP PANEL BY RT-PCR (RSV, FLU A&B, COVID)  [id number]  CBC WITH DIFFERENTIAL/PLATELET  COMPREHENSIVE METABOLIC PANEL  D-DIMER, QUANTITATIVE    EKG None  Radiology No results found.  Procedures Procedures  {Document cardiac monitor, telemetry assessment procedure when appropriate:1}  Medications Ordered in ED Medications - No data to display  ED Course/ Medical Decision Making/ A&P                          Medical Decision Making Amount and/or Complexity of Data Reviewed Labs: ordered. Decision-making details documented in ED Course. Radiology: ordered. Decision-making details documented in ED Course.  Risk OTC drugs.    This  patient presents to the ED for concern of ***, this involves an extensive number of treatment options, and is a complaint that carries with it a high risk of complications and morbidity.  I considered the following differential and admission for this acute, potentially life threatening condition.   MDM:    DDX for syncope includes but is not limited to:  Consider viral syndrome, PNA given body aches/cough/fever. CXR doesn't show any PNA. He had lightheadedness/decreased PO intake and syncope w/ standing, consider possible orthostatic hypotension/syncope. Also consider anemia and electrolyte  abnormalities as possible etiologies. Consider arrhythmias and ACS, although without associated symptoms, less likely.  Given history, exam and workup, low suspicion for HF, ICH (neg head CT), seizure (no witnessed seizure like activity, no postictal period, tongue laceration, bladder incontinence), stroke (no focal neuro deficits), HOCM (no murmur, family history of sudden death), ACS (neg troponin, no anginal pain), aortic dissection (no chest pain), malignant arrhythmia on EKG, or GI bleed (stable hgb). Low suspicion for PE given normal vital signs, absence of chest pain or dyspnea, no evidence of DVT, no recent surgery/immobilization.  Clinical Course as of 12/09/22 2138  Caleen Essex Dec 09, 2022  1935 DG Chest 2 View Negative two view chest x-ray [HN]  2051 D-Dimer, Quant: <0.27 Neg dimer [HN]  2051 SARS Coronavirus 2 by RT PCR(!): POSITIVE + covid [HN]  2137 Troponin I (High Sensitivity): 10 Neg [HN]  2138 CT Head Wo Contrast Normal head CT.  Normal cervical spine CT.   [HN]    Clinical Course User Index [HN] Loetta Rough, MD    Labs: I Ordered, and personally interpreted labs.  The pertinent results include:  those listed above  Imaging Studies ordered: I ordered imaging studies including CTH, CT C-spine, CXR I independently visualized and interpreted imaging. I agree with the radiologist interpretation  Additional history obtained from chart review.    Reevaluation: After the interventions noted above, I reevaluated the patient and found that they have :{resolved/improved/worsened:23923::"improved"}  Social Determinants of Health: Lives independently  Disposition:  ***  Co morbidities that complicate the patient evaluation  Past Medical History:  Diagnosis Date   DVT (deep venous thrombosis) (HCC) 2020   Hypertension      Medicines No orders of the defined types were placed in this encounter.   I have reviewed the patients home medicines and have made  adjustments as needed  Problem List / ED Course: Problem List Items Addressed This Visit   None        {Document critical care time when appropriate:1} {Document review of labs and clinical decision tools ie heart score, Chads2Vasc2 etc:1}  {Document your independent review of radiology images, and any outside records:1} {Document your discussion with family members, caretakers, and with consultants:1} {Document social determinants of health affecting pt's care:1} {Document your decision making why or why not admission, treatments were needed:1}  This note was created using dictation software, which may contain spelling or grammatical errors.

## 2023-05-05 ENCOUNTER — Ambulatory Visit (HOSPITAL_COMMUNITY)
Admission: EM | Admit: 2023-05-05 | Discharge: 2023-05-05 | Disposition: A | Discharge status: Home / Self Care | Payer: No Payment, Other

## 2023-05-05 NOTE — ED Notes (Signed)
 Pt needed to be seen upstairs for outpatient services. Pt did not want to be seen by provider on shift at this time.

## 2023-05-12 ENCOUNTER — Other Ambulatory Visit (HOSPITAL_COMMUNITY)
Admission: EM | Admit: 2023-05-12 | Discharge: 2023-05-14 | Disposition: A | Discharge status: Home / Self Care | Payer: No Payment, Other | Attending: Psychiatry | Admitting: Psychiatry

## 2023-05-12 ENCOUNTER — Ambulatory Visit (HOSPITAL_COMMUNITY)
Admission: EM | Admit: 2023-05-12 | Discharge: 2023-05-12 | Disposition: A | Discharge status: Other Acute Inpatient Hospital | Payer: No Payment, Other

## 2023-05-12 DIAGNOSIS — F1994 Other psychoactive substance use, unspecified with psychoactive substance-induced mood disorder: Secondary | ICD-10-CM | POA: Insufficient documentation

## 2023-05-12 DIAGNOSIS — Z5900 Homelessness unspecified: Secondary | ICD-10-CM | POA: Insufficient documentation

## 2023-05-12 DIAGNOSIS — F1110 Opioid abuse, uncomplicated: Secondary | ICD-10-CM | POA: Insufficient documentation

## 2023-05-12 DIAGNOSIS — F1410 Cocaine abuse, uncomplicated: Secondary | ICD-10-CM | POA: Diagnosis not present

## 2023-05-12 DIAGNOSIS — F1010 Alcohol abuse, uncomplicated: Secondary | ICD-10-CM | POA: Diagnosis not present

## 2023-05-12 DIAGNOSIS — R440 Auditory hallucinations: Secondary | ICD-10-CM | POA: Insufficient documentation

## 2023-05-12 DIAGNOSIS — F1910 Other psychoactive substance abuse, uncomplicated: Secondary | ICD-10-CM | POA: Diagnosis present

## 2023-05-12 DIAGNOSIS — F419 Anxiety disorder, unspecified: Secondary | ICD-10-CM | POA: Insufficient documentation

## 2023-05-12 MED ORDER — MAGNESIUM HYDROXIDE 400 MG/5ML PO SUSP: 30.0000 mL | Freq: Every day | ORAL | Status: DC | PRN

## 2023-05-12 MED ORDER — ALUM & MAG HYDROXIDE-SIMETH 200-200-20 MG/5ML PO SUSP: 30.0000 mL | ORAL | Status: DC | PRN

## 2023-05-12 MED ORDER — TRAZODONE HCL 50 MG PO TABS: 50.0000 mg | ORAL_TABLET | Freq: Every evening | ORAL | Status: DC | PRN

## 2023-05-12 MED ORDER — ACETAMINOPHEN 325 MG PO TABS: 650.0000 mg | ORAL_TABLET | Freq: Four times a day (QID) | ORAL | Status: DC | PRN

## 2023-05-12 NOTE — Progress Notes (Signed)
   05/12/23 2000  [facility] Triage Screening (Walk-ins at [facility] only)  How Did You Hear About Us ? Self  What Is the Reason for Your Visit/Call Today? Edward Barr, a 53 year old male, presents voluntarily to [facility] requesting assistance with his addiction. 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 He first used oxycodone  at the age of 57 and has used it intermittently over the years, but has been using it daily since the age of 88. His average daily use is 5 tablets of 10mg  oxycodone , with his last use occurring yesterday. Additionally, he reports using cocaine, which he began at the age of 35. He used it daily for 2 years, with an average usage of 1 gram per day, and his last use was also yesterday. He denies alcohol use.  Current withdrawal symptoms include shaking and stomach aches. His treatment history includes an admission to [facility] in June 2024, followed by participation in [facility]IOP, which he reports he did not successfully complete. The negative consequences of his substance use include loss of relationships, employment, housing, and financial stability. He denies suicidal ideation (SI), self-injurious behaviors, homicidal ideation (HI), auditory or visual hallucinations (AVH), and paranoia. He does not have access to firearms or weapons.  Edward Barr is currently homeless and has been for 3 years. He is single, unemployed, and is a father of 9 children with 20 grandchildren.  How Long Has This Been Causing You Problems? <Week  Have You Recently Had Any Thoughts About Hurting Yourself? No  Are You Planning to Commit Suicide/Harm Yourself At This time? No  Have you Recently Had Thoughts About Hurting Someone Sherral? No  Are You Planning To Harm Someone At This Time? No  Physical Abuse Denies  Verbal Abuse Denies  Sexual Abuse Denies  Exploitation of patient/patient's resources Denies  Self-Neglect Denies  Possible abuse reported to: Other (Comment) (No hx of abuse. No reported abuse due to no hx of  abuse.)  Are you currently experiencing any auditory, visual or other hallucinations? No  Have You Used Any Alcohol or Drugs in the Past 24 Hours? Yes  How long ago did you use Drugs or Alcohol? Yesterday, he reports use of cocaine and oxycodone .  What Did You Use and How Much? Edward Barr, a 53 year old male, presents voluntarily to [facility] requesting assistance with his addiction. 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 He first used oxycodone  at the age of 4 and has used it intermittently over the years, but has been using it daily since the age of 36. His average daily use is 5 tablets of 10mg  oxycodone , with his last use occurring yesterday. Additionally, he reports using cocaine, which he began at the age of 50. He used it daily for 2 years, with an average usage of 1 gram per day, and his last use was also yesterday.  Do you have any current medical co-morbidities that require immediate attention? No  Clinician description of patient physical appearance/behavior: cooperative. tearful.  What Do You Feel Would Help You the Most Today? Alcohol or Drug Use Treatment  If access to [facility] was not available, would you have sought care in the Emergency Department? No  Determination of Need Urgent (48 hours)  Options For Referral Outpatient Therapy;Medication Management;Facility-Based Crisis;Chemical Dependency Intensive Outpatient Therapy (CDIOP)  Determination of Need filed? Yes

## 2023-05-12 NOTE — ED Notes (Signed)
 Pt came to [facility] to get help with cocaine and oxycodone, hoping to get a residential placement. Pt is A & O x 4. Pt is oriented to the unit and provided with meal. Pt denies SI/HI/AVH. Will continue to monitor for safety and provide support.

## 2023-05-12 NOTE — ED Notes (Signed)
 Patient is sleeping. Respirations equal and unlabored, skin warm and dry. No change in assessment or acuity. Routine safety checks conducted according to facility protocol. Will continue to monitor for safety.

## 2023-05-12 NOTE — BH Assessment (Addendum)
 Comprehensive Clinical Assessment (CCA) Note  05/12/2023 Edward Barr [id number]  Disposition: Per Kathryne Show, NP, patient meets the criteria for admission to the [facility] as per the assessment conducted and the MSE findings. The TTS assessment has been completed, and based on the findings, admittance to [facility] has been recommended.   Chief Complaint: Addiction, Substance Use, Depression  Visit Diagnosis:  Opioid Dependence, F11.20 Cocaine Dependence, F14.20 Major Depressive Disorder, Single Episode, Unspecified, 296.30 Generalized Anxiety Disorder, 300.02  Edward Barr, a 53 year old male, presents voluntarily to [facility] seeking assistance with his addiction. He reports a long history of oxycodone  and cocaine use, with oxycodone  first being introduced at the age of 51. Although he used it intermittently over the years, he has been using it daily since the age of 40, with an average daily consumption of 5 tablets of 10mg  oxycodone . His last use of oxycodone  occurred yesterday. Additionally, Edward Barr began using cocaine at the age of 46 and used it daily for two years, averaging 1 gram per day. His last use of cocaine also occurred yesterday. He denies any alcohol use.  Currently, Cher reports experiencing withdrawal symptoms, including shaking and stomach aches. His treatment history includes an admission to [facility] in June 2024, followed by participation in the CDIOP program, which he acknowledges he did not complete successfully. Negative consequences of his addiction include the loss of relationships, employment, housing, and financial stability. Despite these challenges, he denies suicidal ideation (SI), self-injurious behaviors, homicidal ideation (HI), auditory or visual hallucinations (AVH), or paranoia. He also denies access to firearms or weapons.  Edward Barr is currently homeless and has been for the past three years. He is unemployed and is a father to 40 children and a grandfather to 73 grandchildren. His  situation reflects significant personal and social losses resulting from his substance use.  Edward Barr is dressed in a casual manner.  His hygiene fair, and his clothing is appropriate. He is cooperative.  He sits appropriately and makes intermittent eye contact. His speech is normal in rate and tone, but there are signs of pressure when discussing his addiction and personal history. He is tearful when providing responses.  Edward Barr reports feeling anxious and frustrated, especially in regard to his current living situation and struggles with addiction. His affect is congruent with his mood, appearing tense and uneasy. His thought processes are logical and coherent, with no evidence of disorganized thinking or flight of ideas.   There is no indication of delusions or paranoia, and he denies experiencing hallucinations. He is fully alert and oriented to time, place, person, and situation. His memory appears intact, and he demonstrates fair insight into his addiction. Edward Barr's judgment appears fair but at times compromised due to his ongoing substance use and current living circumstances, as reflected in his inability to successfully complete treatment and the significant negative consequences he has faced. His impulse control seems fair, although withdrawal symptoms may contribute to some emotional instability. Overall, Galileo is a middle-aged male struggling with chronic substance use, withdrawal symptoms, and significant personal losses.    CCA Screening, Triage and Referral (STR)  Patient Reported Information How did you hear about us ? Self  What Is the Reason for Your Visit/Call Today? Edward Barr, a 52 year old male, presents voluntarily to [facility] requesting assistance with his addiction. 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 He first used oxycodone  at the age of 28 and has used it intermittently over the years, but has been using it daily since the age of 74. His average  daily use is 5 tablets  of 10mg  oxycodone , with his last use occurring yesterday. Additionally, he reports using cocaine, which he began at the age of 19. He used it daily for 2 years, with an average usage of 1 gram per day, and his last use was also yesterday. He denies alcohol use.  Current withdrawal symptoms include shaking and stomach aches. His treatment history includes an admission to [facility] in June 2024, followed by participation in CDIOP, which he reports he did not successfully complete. The negative consequences of his substance use include loss of relationships, employment, housing, and financial stability. He denies suicidal ideation (SI), self-injurious behaviors, homicidal ideation (HI), auditory or visual hallucinations (AVH), and paranoia. He does not have access to firearms or weapons.  Edward Barr is currently homeless and has been for 3 years. He is single, unemployed, and is a father of 9 children with 4 grandchildren.  How Long Has This Been Causing You Problems? <Week  What Do You Feel Would Help You the Most Today? Alcohol or Drug Use Treatment   Have You Recently Had Any Thoughts About Hurting Yourself? No  Are You Planning to Commit Suicide/Harm Yourself At This time? No   Flowsheet Row ED from 05/12/2023 in [facility] ED from 12/09/2022 in [facility] Emergency Department at [facility] ED from 10/28/2022 in [facility] Department at [facility]  C-SSRS RISK CATEGORY No Risk No Risk No Risk       Have you Recently Had Thoughts About Hurting Someone Edward Barr? No  Are You Planning to Harm Someone at This Time? No  Explanation: No data recorded  Have You Used Any Alcohol or Drugs in the Past 24 Hours? Yes  What Did You Use and How Much? Edward Barr, a 53 year old male, presents voluntarily to [facility] requesting assistance with his addiction. 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 He first used oxycodone  at the age of 89 and has  used it intermittently over the years, but has been using it daily since the age of 41. His average daily use is 5 tablets of 10mg  oxycodone , with his last use occurring yesterday. Additionally, he reports using cocaine, which he began at the age of 50. He used it daily for 2 years, with an average usage of 1 gram per day, and his last use was also yesterday.   Do You Currently Have a Therapist/Psychiatrist? No data recorded Name of Therapist/Psychiatrist:    Have You Been Recently Discharged From Any Office Practice or Programs? No data recorded Explanation of Discharge From Practice/Program: No data recorded    CCA Screening Triage Referral Assessment Type of Contact: Face to Face  Telemedicine Service Delivery: n/a Is this Initial or Reassessment? n/a Date Telepsych consult ordered in [facility]:  n/a Time Telepsych consult ordered in [facility]: n/a   Location of Assessment: [facility]  Provider Location: [facility] n/a  Collateral Involvement: None obtained, no collateral information at this time.   Does Patient Have a Automotive Engineer Guardian?  No legal guardian.  Legal Guardian Contact Information: No legal guardian.  Copy of Legal Guardianship Form: No Legal Guardian Notified of Arrival: No Legal Guardian Notified of Pending Discharge: No If Minor and Not Living with Parent(s), Who has Custody? N/A Is CPS involved or ever been involved? No Is APS involved or ever been involved? No  Patient Determined To Be At Risk for Harm To Self or Others Based on  Review of Patient Reported Information or Presenting Complaint? None reported  Method: N/A Availability of Means: No access to means Intent: No access to means Notification Required: None at this time.  Additional Information for Danger to Others Potential: Patient denies.  Additional Comments for Danger to Others Potential: Patient denies.  Are There Guns or Other Weapons in Your Home?  Denies.  Types of Guns/Weapons: No weapons, per patient.  Are These Weapons Safely Secured?                           Patient denies access to weapons.  Who Could Verify You Are Able To Have These Secured: No access to weapons at this time.  Do You Have any Outstanding Charges, Pending Court Dates, Parole/Probation? None at this time.  Contacted To Inform of Risk of Harm To Self or Others: No data recorded   Does Patient Present under Involuntary Commitment? No, patient is voluntary.   [place] of Residence: [place]   Patient Currently Receiving the Following Services: None at this time   Determination of Need: Urgent (48 hours)   Options For Referral: Outpatient Therapy; Medication Management; Facility-Based Crisis; Chemical Dependency Intensive Outpatient Therapy (CDIOP)     CCA Biopsychosocial Patient Reported Schizophrenia/Schizoaffective Diagnosis in Past: No   Strengths: Patient is communicative and willing to seek treatment.   Mental Health Symptoms Depression:  Hopelessness; Fatigue; Difficulty Concentrating; Change in energy/activity; Tearfulness; Irritability   Duration of Depressive symptoms: Duration of Depressive Symptoms: Greater than two weeks   Mania:  None   Anxiety:   Fatigue; Difficulty concentrating; Irritability; Worrying   Psychosis:  None   Duration of Psychotic symptoms:    Trauma:  None   Obsessions:  None   Compulsions:  None   Inattention:  None   Hyperactivity/Impulsivity:  None   Oppositional/Defiant Behaviors:  None   Emotional Irregularity:  None   Other Mood/Personality Symptoms:  Calm and cooperative. Tearful.    Mental Status Exam Appearance and self-care  Stature:  Average   Weight:  Average weight   Clothing:  Neat/clean   Grooming:  Normal   Cosmetic use:  Age appropriate   Posture/gait:  Normal   Motor activity:  Not Remarkable   Sensorium  Attention:  Normal   Concentration:  Normal   Orientation:   Time; Situation; Place; Person; Object   Recall/memory:  Normal   Affect and Mood  Affect:  Flat; Appropriate   Mood:  Depressed   Relating  Eye contact:  None   Facial expression:  Depressed; Sad   Attitude toward examiner:  Cooperative   Thought and Language  Speech flow: Clear and Coherent   Thought content:  Appropriate to Mood and Circumstances   Preoccupation:  None   Hallucinations:  None   Organization:  Coherent   [employer] of Knowledge:  Fair   Intelligence:  Average   Abstraction:  Normal   Judgement:  Normal   Reality Testing:  Adequate   Insight:  Fair   Decision Making:  Normal   Social Functioning  Social Maturity:  Isolates   Social Judgement:  Normal   Stress  Stressors:  Housing; Grief/losses; Financial   Coping Ability:  Normal   Skill Deficits:  Communication   Supports:  Support needed     Religion: Religion/Spirituality Are You A Religious Person?: No How Might This Affect Treatment?: none reported  Leisure/Recreation: Leisure / Recreation Do You Have Hobbies?: No  Exercise/Diet: Exercise/Diet Do You Exercise?: No Have You Gained or Lost A Significant Amount of Weight in the Past Six Months?: No Do You Follow a Special Diet?: No Do You Have Any Trouble Sleeping?: Yes Explanation of Sleeping Difficulties: varies   CCA Employment/Education Employment/Work Situation: Employment / Work Situation Employment Situation: Employed Work Stressors: Patient reports that Patient's Job has Been Impacted by Current Illness: No Has Patient ever Been in Equities Trader?: No  Education: Education Is Patient Currently Attending School?: No Last Grade Completed:  (GED) Did You Product Manager?: No Did You Have An Individualized Education Program (IIEP): No Did You Have Any Difficulty At School?: No Patient's Education Has Been Impacted by Current Illness: No   CCA Family/Childhood History Family and  Relationship History: Family history Marital status: Single Does patient have children?: Yes How many children?: 9 How is patient's relationship with their children?: Patient has 9 children and 33 grandchildren. His relationship is strained with his children and grandchildren due to his drug use.  Childhood History:  Childhood History By whom was/is the patient raised?: Mother Did patient suffer any verbal/emotional/physical/sexual abuse as a child?: No Did patient suffer from severe childhood neglect?: No Has patient ever been sexually abused/assaulted/raped as an adolescent or adult?: No Was the patient ever a victim of a crime or a disaster?: No Witnessed domestic violence?: No Has patient been affected by domestic violence as an adult?: No       CCA Substance Use Alcohol/Drug Use: Alcohol / Drug Use Pain Medications: SEE MAR Prescriptions: SEE MAR Over the Counter: SEE MAR History of alcohol / drug use?: Yes Longest period of sobriety (when/how long): months at a time Negative Consequences of Use: Personal relationships, Surveyor, Quantity, Work / School Withdrawal Symptoms:  (Shakes and stomach aches) Substance #1 Name of Substance 1: Oxycodone  1 - Age of First Use: 53 years old 1 - Amount (size/oz): (5) 10mg  pills 1 - Frequency: daily 1 - Duration: on-going 1 - Last Use / Amount: 05/11/2023 -  (5) 10mg  pills 1 - Method of Aquiring: varies 1- Route of Use: oral Substance #2 Name of Substance 2: Cocaine 2 - Age of First Use: 53 years old 2 - Amount (size/oz): 1 gram 2 - Frequency: daily 2 - Duration: daily for 2 years 2 - Last Use / Amount: 05/11/2023 2 - Method of Aquiring: varies 2 - Route of Substance Use: snort                     ASAM's:  Six Dimensions of Multidimensional Assessment  Dimension 1:  Acute Intoxication and/or Withdrawal Potential:      Dimension 2:  Biomedical Conditions and Complications:      Dimension 3:  Emotional, Behavioral, or  Cognitive Conditions and Complications:     Dimension 4:  Readiness to Change:     Dimension 5:  Relapse, Continued use, or Continued Problem Potential:     Dimension 6:  Recovery/Living Environment:     ASAM Severity Score:    ASAM Recommended Level of Treatment:     Substance use Disorder (SUD) Substance Use Disorder (SUD)  Checklist Symptoms of Substance Use: Continued use despite having a persistent/recurrent physical/psychological problem caused/exacerbated by use, Continued use despite persistent or recurrent social, interpersonal problems, caused or exacerbated by use, Evidence of withdrawal (Comment), Recurrent use that results in a failure to fulfill major role obligations (work, school, home), Social, occupational, recreational activities given up or reduced due to use, Persistent desire or  unsuccessful efforts to cut down or control use, Large amounts of time spent to obtain, use or recover from the substance(s), Presence of craving or strong urge to use, Repeated use in physically hazardous situations, Substance(s) often taken in larger amounts or over longer times than was intended  Recommendations for Services/Supports/Treatments: Recommendations for Services/Supports/Treatments Recommendations For Services/Supports/Treatments: Inpatient Hospitalization, Medication Management, Facility Based Crisis, CD-IOP Intensive Chemical Dependency Program, Residential-Level 1  Disposition Recommendation per psychiatric provider: We recommend inpatient psychiatric hospitalization when medically cleared. Patient is under voluntary admission status at this time; please IVC if attempts to leave hospital.   DSM5 Diagnoses: Patient Active Problem List   Diagnosis Date Noted   Polysubstance abuse (HCC) 05/12/2023   PTSD (post-traumatic stress disorder) 10/24/2022   Cocaine abuse (HCC) 10/24/2022   Opiate use 10/24/2022   Substance induced mood disorder (HCC) 10/24/2022   Substance use disorder  10/20/2022     Referrals to Alternative Service(s): Referred to Alternative Service(s):   Place:   Date:   Time:    Referred to Alternative Service(s):   Place:   Date:   Time:    Referred to Alternative Service(s):   Place:   Date:   Time:    Referred to Alternative Service(s):   Place:   Date:   Time:     Cameron Kiang, Counselor

## 2023-05-12 NOTE — ED Provider Notes (Signed)
 Facility Based Crisis Admission H&P  Date: 05/13/23 Patient Name: Edward Barr MRN: [id number] Chief Complaint: I need help with drug abuse  Diagnoses:  Final diagnoses:  Cocaine abuse (HCC)  Substance induced mood disorder (HCC)  Opioid abuse (HCC)  Alcohol abuse  Polysubstance abuse (HCC)    HPI: Edward Barr is a 53 year old male with psychiatric history significant for polysubstance abuse (alcohol, opioids, cocaine), substance-induced mood disorder, and anxiety.  Patient presented voluntarily to [facility] reporting depressive symptoms and requesting substance abuse treatment.  Patient was evaluated face-to-face and his chart was reviewed by this nurse practitioner.  On assessment, patient reports worsening depressive symptoms due to ongoing struggle with substance abuse.  Patient says that he started abusing cocaine when he was 53 years old, alcohol since age 26, and opiates since age 56.  He reports consuming 5 tablets of Oxycodone  10mg  (total of 50mg ) daily, 1 gram of cocaine daily, and 6 (12 oz) beers per day.  He says that he last engaged in abusing alcohol, cocaine, and Percocet yesterday.  He reports withdrawal symptoms of abdominal cramps and aches. He denies a history of alcohol withdrawal seizures and DTs. He denies all other withdrawal symptoms.  He says his longest period of sobriety was for 10 years while incarcerated.  He reports relapsing shortly after release from prison.  He reported he last attempted substance abuse in 10/2022 but did not follow up with treatment due to his mother having a heart attack.  Patient says he is motivated to seek treatment at this time due to drug abuse negatively impacted his relationship with his fiance and his children. Patient is endorsing depressive symptoms of low mood, loneliness, hopelessness, worthlessness, irritability, poor focus, crying spells, hypersomnia, and poor motivation.  He denies suicidal ideation and homicidal ideation. He denies  history of suicidal attempt or self-injurious behavior. He reports auditory hallucination of  screams and feeling paranoid only when using cocaine. He denies experiencing any hallucination or paranoia at this time.  He says that he is currently homeless but able to return to his fiance's resident after participating in substance abuse program.   During assessment, patient is sitting in assessment room in no apparent distress.  He is tearful upon approach, alert and oriented x 4, calm and cooperative. He is speaking in a clear tone, moderate rate, and pace with good eye contact. Patient's mood is depressed, affect is congruent with his mood. Patient's thought process is coherent. Judgment is fair, sight is intact. There were no objective signs of psychosis, preoccupation, or delusional thought content.  PHQ 2-9:  Flowsheet Row ED from 05/12/2023 in [facility] ED from 10/20/2022 in [facility]  Thoughts that you would be better off dead, or of hurting yourself in some way Not at all Several days  PHQ-9 Total Score 16 2       Flowsheet Row ED from 05/12/2023 in [facility] Most recent reading at 05/12/2023 11:28 PM ED from 05/12/2023 in [facility] Most recent reading at 05/12/2023  8:09 PM ED from 12/09/2022 in [facility] Department at [facility] Most recent reading at 12/09/2022  5:58 PM  C-SSRS RISK CATEGORY No Risk No Risk No Risk       Screenings    Flowsheet Row Most Recent Value  CIWA-Ar Total 1       Total Time spent with patient: 30 minutes  Musculoskeletal  Strength & Muscle Tone: within  normal limits Gait & Station: normal Patient leans: Right  Psychiatric Specialty Exam  Presentation General Appearance:  Appropriate for Environment  Eye Contact: Good  Speech: Clear and Coherent  Speech Volume: Normal  Handedness: Right   Mood  and Affect  Mood: Depressed  Affect: Tearful   Thought Process  Thought Processes: Coherent  Descriptions of Associations:Intact  Orientation:Full (Time, Place and Person)  Thought Content:WDL  Diagnosis of Schizophrenia or Schizoaffective disorder in past: No   Hallucinations:Hallucinations: Auditory Description of Auditory Hallucinations: screams pt reports AH occurs only when he uses cocain  Ideas of Reference:None  Suicidal Thoughts:Suicidal Thoughts: No  Homicidal Thoughts:Homicidal Thoughts: No   Sensorium  Memory: Immediate Good; Recent Good; Remote Good  Judgment: Fair  Insight: Good   Executive Functions  Concentration: Good  Attention Span: Good  Recall: Good  Fund of Knowledge: Good  Language: Good   Psychomotor Activity  Psychomotor Activity: Psychomotor Activity: Normal   Assets  Assets: Communication Skills; Desire for Improvement; Housing; Physical Health; Social Support   Sleep  Sleep: Sleep: Good Number of Hours of Sleep: 12   Nutritional Assessment (For OBS and FBC admissions only) Has the patient had a weight loss or gain of 10 pounds or more in the last 3 months?: No Has the patient had a decrease in food intake/or appetite?: No Does the patient have dental problems?: No Does the patient have eating habits or behaviors that may be indicators of an eating disorder including binging or inducing vomiting?: No Has the patient recently lost weight without trying?: 0 Has the patient been eating poorly because of a decreased appetite?: 0 Malnutrition Screening Tool Score: 0    Physical Exam Vitals and nursing note reviewed.  Constitutional:      General: He is not in acute distress.    Appearance: Normal appearance. He is well-developed.  HENT:     Head: Normocephalic and atraumatic.  Eyes:     Conjunctiva/sclera: Conjunctivae normal.  Cardiovascular:     Rate and Rhythm: Normal rate.  Pulmonary:      Effort: Pulmonary effort is normal.  Abdominal:     Palpations: Abdomen is soft.     Tenderness: There is no abdominal tenderness.  Musculoskeletal:        General: Normal range of motion.     Cervical back: Normal range of motion.  Skin:    General: Skin is warm and dry.     Capillary Refill: Capillary refill takes less than 2 seconds.  Neurological:     Mental Status: He is alert and oriented to person, place, and time.  Psychiatric:        Attention and Perception: He perceives auditory hallucinations.        Mood and Affect: Mood is anxious and depressed.        Speech: Speech normal.        Behavior: Behavior normal. Behavior is cooperative.        Thought Content: Thought content normal.    Review of Systems  Constitutional: Negative.   HENT: Negative.    Eyes: Negative.   Respiratory: Negative.    Cardiovascular: Negative.   Gastrointestinal: Negative.   Genitourinary: Negative.   Musculoskeletal: Negative.   Skin: Negative.   Neurological: Negative.   Endo/Heme/Allergies: Negative.   Psychiatric/Behavioral:  Positive for depression, hallucinations and substance abuse.     Blood pressure 105/70, pulse 70, temperature 97.9 F (36.6 C), temperature source Oral, resp. rate 20, SpO2 99%. There is no height  or weight on file to calculate BMI.  Past Psychiatric History: Cocaine abuse,     Is the patient at risk to self? No  Has the patient been a risk to self in the past 6 months? No .    Has the patient been a risk to self within the distant past? No   Is the patient a risk to others? No   Has the patient been a risk to others in the past 6 months? No   Has the patient been a risk to others within the distant past? No   Past Medical History:  Past Medical History:  Diagnosis Date   DVT (deep venous thrombosis) (HCC) 2020   Hypertension     Family History:  Family History  Problem Relation Age of Onset   Diabetes Mother    Heart Problems Mother    Heart  Problems Father     Social History:  Social History   Tobacco Use   Smoking status: Never   Smokeless tobacco: Never  Substance Use Topics   Alcohol use: No   Drug use: Yes    Types: Cocaine    Comment: snorts cocaine     Last Labs:  Admission on 12/09/2022, Discharged on 12/10/2022  Component Date Value Ref Range Status   SARS Coronavirus 2 by RT PCR 12/09/2022 POSITIVE (A)  NEGATIVE Final   Comment: (NOTE) SARS-CoV-2 target nucleic acids are DETECTED.  The SARS-CoV-2 RNA is generally detectable in upper respiratory specimens during the acute phase of infection. Positive results are indicative of the presence of the identified virus, but do not rule out bacterial infection or co-infection with other pathogens not detected by the test. Clinical correlation with patient history and other diagnostic information is necessary to determine patient infection status. The expected result is Negative.  Fact Sheet for Patients: bloggercourse.com  Fact Sheet for Healthcare Providers: seriousbroker.it  This test is not yet approved or cleared by the [place]  FDA and  has been authorized for detection and/or diagnosis of SARS-CoV-2 by FDA under an Emergency Use Authorization (EUA).  This EUA will remain in effect (meaning this test can be used) for the duration of  the COVID-19 declaration under Section 564(b)(1) of the A                          ct, 21 U.S.C. section 360bbb-3(b)(1), unless the authorization is terminated or revoked sooner.     Influenza A by PCR 12/09/2022 NEGATIVE  NEGATIVE Final   Influenza B by PCR 12/09/2022 NEGATIVE  NEGATIVE Final   Comment: (NOTE) The Xpert Xpress SARS-CoV-2/FLU/RSV plus assay is intended as an aid in the diagnosis of influenza from Nasopharyngeal swab specimens and should not be used as a sole basis for treatment. Nasal washings and aspirates are unacceptable for Xpert Xpress  SARS-CoV-2/FLU/RSV testing.  Fact Sheet for Patients: bloggercourse.com  Fact Sheet for Healthcare Providers: seriousbroker.it  This test is not yet approved or cleared by the [place]  FDA and has been authorized for detection and/or diagnosis of SARS-CoV-2 by FDA under an Emergency Use Authorization (EUA). This EUA will remain in effect (meaning this test can be used) for the duration of the COVID-19 declaration under Section 564(b)(1) of the Act, 21 U.S.C. section 360bbb-3(b)(1), unless the authorization is terminated or revoked.     Resp Syncytial Virus by PCR 12/09/2022 NEGATIVE  NEGATIVE Final   Comment: (NOTE) Fact Sheet for Patients: bloggercourse.com  Fact Sheet  for Healthcare Providers: seriousbroker.it  This test is not yet approved or cleared by the [place]  FDA and has been authorized for detection and/or diagnosis of SARS-CoV-2 by FDA under an Emergency Use Authorization (EUA). This EUA will remain in effect (meaning this test can be used) for the duration of the COVID-19 declaration under Section 564(b)(1) of the Act, 21 U.S.C. section 360bbb-3(b)(1), unless the authorization is terminated or revoked.  Performed at [facility] [place], [address] [place], [place] [address]    WBC 12/09/2022 3.3 (L)  4.0 - 10.5 K/uL Final   RBC 12/09/2022 4.85  4.22 - 5.81 MIL/uL Final   Hemoglobin 12/09/2022 12.6 (L)  13.0 - 17.0 g/dL Final   HCT 91/90/7975 40.2  39.0 - 52.0 % Final   MCV 12/09/2022 82.9  80.0 - 100.0 fL Final   MCH 12/09/2022 26.0  26.0 - 34.0 pg Final   MCHC 12/09/2022 31.3  30.0 - 36.0 g/dL Final   RDW 91/90/7975 13.4  11.5 - 15.5 % Final   Platelets 12/09/2022 306  150 - 400 K/uL Final   nRBC 12/09/2022 0.0  0.0 - 0.2 % Final   Neutrophils Relative % 12/09/2022 69  % Final   Neutro Abs 12/09/2022 2.3  1.7 - 7.7 K/uL Final    Lymphocytes Relative 12/09/2022 17  % Final   Lymphs Abs 12/09/2022 0.6 (L)  0.7 - 4.0 K/uL Final   Monocytes Relative 12/09/2022 13  % Final   Monocytes Absolute 12/09/2022 0.4  0.1 - 1.0 K/uL Final   Eosinophils Relative 12/09/2022 1  % Final   Eosinophils Absolute 12/09/2022 0.0  0.0 - 0.5 K/uL Final   Basophils Relative 12/09/2022 0  % Final   Basophils Absolute 12/09/2022 0.0  0.0 - 0.1 K/uL Final   Immature Granulocytes 12/09/2022 0  % Final   Abs Immature Granulocytes 12/09/2022 0.01  0.00 - 0.07 K/uL Final   Performed at [facility], [address] [place], [place] [address]   Sodium 12/09/2022 135  135 - 145 mmol/L Final   Potassium 12/09/2022 3.4 (L)  3.5 - 5.1 mmol/L Final   Chloride 12/09/2022 102  98 - 111 mmol/L Final   CO2 12/09/2022 22  22 - 32 mmol/L Final   Glucose, Bld 12/09/2022 100 (H)  70 - 99 mg/dL Final   Glucose reference range applies only to samples taken after fasting for at least 8 hours.   BUN 12/09/2022 10  6 - 20 mg/dL Final   Creatinine, Ser 12/09/2022 0.95  0.61 - 1.24 mg/dL Final   Calcium 91/90/7975 9.0  8.9 - 10.3 mg/dL Final   Total Protein 91/90/7975 7.6  6.5 - 8.1 g/dL Final   Albumin 91/90/7975 4.1  3.5 - 5.0 g/dL Final   AST 91/90/7975 19  15 - 41 U/L Final   ALT 12/09/2022 18  0 - 44 U/L Final   Alkaline Phosphatase 12/09/2022 77  38 - 126 U/L Final   Total Bilirubin 12/09/2022 0.4  0.3 - 1.2 mg/dL Final   GFR, Estimated 12/09/2022 >60  >60 mL/min Final   Comment: (NOTE) Calculated using the CKD-EPI Creatinine Equation (2021)    Anion gap 12/09/2022 11  5 - 15 Final   Performed at [facility], [address] [place], [place] [address]   D-Dimer, Earleen 12/09/2022 <0.27  0.00 - 0.50 ug/mL-FEU Final   Comment: (NOTE) At the manufacturer cut-off value of 0.5 g/mL FEU, this assay has a negative  predictive value of 95-100%.This assay is intended for use in conjunction with a clinical pretest  probability (PTP) assessment model to exclude pulmonary embolism (PE) and deep venous thrombosis (DVT) in outpatients suspected of PE or DVT. Results should be correlated with clinical presentation. Performed at [facility], [address], [place], [place] [address]    Troponin I (High Sensitivity) 12/09/2022 10  <18 ng/L Final   Comment: (NOTE) Elevated high sensitivity troponin I (hsTnI) values and significant  changes across serial measurements may suggest ACS but many other  chronic and acute conditions are known to elevate hsTnI results.  Refer to the Links section for chest pain algorithms and additional  guidance. Performed at [facility], [address] [place], [place] [address]    Troponin I (High Sensitivity) 12/09/2022 11  <18 ng/L Final   Comment: (NOTE) Elevated high sensitivity troponin I (hsTnI) values and significant  changes across serial measurements may suggest ACS but many other  chronic and acute conditions are known to elevate hsTnI results.  Refer to the Links section for chest pain algorithms and additional  guidance. Performed at [facility], [address] [place], [place] [address]    HIV-1 P24 Antigen - HIV24 12/09/2022 NON REACTIVE  NON REACTIVE Final   Comment: (NOTE) Detection of p24 may be inhibited by biotin in the sample, causing false negative results in acute infection.    HIV 1/2 Antibodies 12/09/2022 NON REACTIVE  NON REACTIVE Final   Interpretation (HIV Ag Ab) 12/09/2022 A non reactive test result means that HIV 1 or HIV 2 antibodies and HIV 1 p24 antigen were not detected in the specimen.   Final   Comment: RESULT CALLED TO, READ BACK BY AND VERIFIED WITH: GRAY,S AT 2309 ON 12/09/22 BY LUZOLOP Performed at [facility], [address], [place], [place] [address]     Allergies: Pork-derived products  Medications:  Facility Ordered Medications   Medication   acetaminophen  (TYLENOL ) tablet 650 mg   alum & mag hydroxide-simeth (MAALOX/MYLANTA) 200-200-20 MG/5ML suspension 30 mL   magnesium  hydroxide (MILK OF MAGNESIA) suspension 30 mL   traZODone  (DESYREL ) tablet 50 mg   thiamine  (VITAMIN B1) injection 100 mg   [START ON 05/14/2023] thiamine  (VITAMIN B1) tablet 100 mg   multivitamin with minerals tablet 1 tablet   LORazepam  (ATIVAN ) tablet 1 mg   hydrOXYzine  (ATARAX ) tablet 25 mg   loperamide  (IMODIUM ) capsule 2-4 mg   ondansetron  (ZOFRAN -ODT) disintegrating tablet 4 mg   dicyclomine  (BENTYL ) tablet 20 mg   methocarbamol  (ROBAXIN ) tablet 500 mg   naproxen  (NAPROSYN ) tablet 500 mg   PTA Medications  Medication Sig   hydrOXYzine  (ATARAX ) 25 MG tablet Take 1 tablet (25 mg total) by mouth 3 (three) times daily as needed for anxiety.    Long Term Goals: Improvement in symptoms so as ready for discharge  Short Term Goals: Patient will verbalize feelings in meetings with treatment team members., Patient will attend at least of 50% of the groups daily., Pt will complete the PHQ9 on admission, day 3 and discharge., Patient will participate in completing the [place] Suicide Severity Rating Scale, Patient will score a low risk of violence for 24 hours prior to discharge, and Patient will take medications as prescribed daily.  Medical Decision Making  Patient will be admitted to [facility] for substance abuse treatment and stabilization. Initiate clonidine withdrawal protocol -clonidine taper -hydroxyzine  25 mg PO every 6 hours prn for anxiety -ondansetron  4  mg ODT every 6 hours prn nausea/vomiting -naproxen  500 mg BID prn for pain -dicyclomine  20 mg PO every 6 hours prn abdominal cramping -loperamide  2-4 mg capsule prn diarrhea -methocarbamol  500 mg PO every 8 hours prn spasms   Initiate CIWA protocol -lorazepam  1 mg every 6 hours prn for CIWA >10 -thiamine  100 mg daily for nutritional supplementation -hydroxyzine  25 mg every 6 hours  prn for anxiety, CIWA < or = 10 -ondansetron  4 mg ODT every 6 hours prn nausea/vomiting -loperamide  2-4 mg capsule prn diarrhea or loose stools     Recommendations  Based on my evaluation the patient does not appear to have an emergency medical condition.  Kathryne DELENA Show, NP 05/13/23  7:08 AM

## 2023-05-13 DIAGNOSIS — F1410 Cocaine abuse, uncomplicated: Secondary | ICD-10-CM | POA: Diagnosis not present

## 2023-05-13 DIAGNOSIS — F1994 Other psychoactive substance use, unspecified with psychoactive substance-induced mood disorder: Secondary | ICD-10-CM | POA: Diagnosis not present

## 2023-05-13 DIAGNOSIS — F1110 Opioid abuse, uncomplicated: Secondary | ICD-10-CM | POA: Diagnosis not present

## 2023-05-13 DIAGNOSIS — F1010 Alcohol abuse, uncomplicated: Secondary | ICD-10-CM | POA: Diagnosis not present

## 2023-05-13 LAB — POCT URINE DRUG SCREEN - MANUAL ENTRY (I-SCREEN)
POC Amphetamine UR: NOT DETECTED
POC Buprenorphine (BUP): NOT DETECTED
POC Cocaine UR: POSITIVE — AB
POC Marijuana UR: POSITIVE — AB
POC Methadone UR: NOT DETECTED
POC Methamphetamine UR: NOT DETECTED
POC Morphine: NOT DETECTED
POC Oxazepam (BZO): NOT DETECTED
POC Oxycodone UR: NOT DETECTED
POC Secobarbital (BAR): NOT DETECTED

## 2023-05-13 LAB — CBC WITH DIFFERENTIAL/PLATELET
Abs Immature Granulocytes: 0.02 10*3/uL (ref 0.00–0.07)
Basophils Absolute: 0 10*3/uL (ref 0.0–0.1)
Basophils Relative: 1 %
Eosinophils Absolute: 0.6 10*3/uL — ABNORMAL HIGH (ref 0.0–0.5)
Eosinophils Relative: 11 %
HCT: 42.2 % (ref 39.0–52.0)
Hemoglobin: 13.1 g/dL (ref 13.0–17.0)
Immature Granulocytes: 0 %
Lymphocytes Relative: 43 %
Lymphs Abs: 2.4 10*3/uL (ref 0.7–4.0)
MCH: 25.5 pg — ABNORMAL LOW (ref 26.0–34.0)
MCHC: 31 g/dL (ref 30.0–36.0)
MCV: 82.3 fL (ref 80.0–100.0)
Monocytes Absolute: 0.3 10*3/uL (ref 0.1–1.0)
Monocytes Relative: 6 %
Neutro Abs: 2.1 10*3/uL (ref 1.7–7.7)
Neutrophils Relative %: 39 %
Platelets: 343 10*3/uL (ref 150–400)
RBC: 5.13 MIL/uL (ref 4.22–5.81)
RDW: 14.3 % (ref 11.5–15.5)
WBC: 5.4 10*3/uL (ref 4.0–10.5)
nRBC: 0 % (ref 0.0–0.2)

## 2023-05-13 LAB — COMPREHENSIVE METABOLIC PANEL
ALT: 60 U/L — ABNORMAL HIGH (ref 0–44)
AST: 55 U/L — ABNORMAL HIGH (ref 15–41)
Albumin: 3.4 g/dL — ABNORMAL LOW (ref 3.5–5.0)
Alkaline Phosphatase: 79 U/L (ref 38–126)
Anion gap: 7 (ref 5–15)
BUN: 13 mg/dL (ref 6–20)
CO2: 28 mmol/L (ref 22–32)
Calcium: 9.4 mg/dL (ref 8.9–10.3)
Chloride: 102 mmol/L (ref 98–111)
Creatinine, Ser: 0.88 mg/dL (ref 0.61–1.24)
GFR, Estimated: >60 mL/min (ref >60)
Glucose, Bld: 102 mg/dL — ABNORMAL HIGH (ref 70–99)
Potassium: 3.9 mmol/L (ref 3.5–5.1)
Sodium: 137 mmol/L (ref 135–145)
Total Bilirubin: 0.3 mg/dL (ref 0.0–1.2)
Total Protein: 6.8 g/dL (ref 6.5–8.1)

## 2023-05-13 LAB — HEMOGLOBIN A1C
Hgb A1c MFr Bld: 5.8 % — ABNORMAL HIGH (ref 4.8–5.6)
Mean Plasma Glucose: 119.76 mg/dL

## 2023-05-13 LAB — LIPID PANEL
Cholesterol: 196 mg/dL (ref 0–200)
HDL: 61 mg/dL (ref >40)
LDL Cholesterol: 104 mg/dL — ABNORMAL HIGH (ref 0–99)
Total CHOL/HDL Ratio: 3.2 {ratio}
Triglycerides: 155 mg/dL — ABNORMAL HIGH (ref <150)
VLDL: 31 mg/dL (ref 0–40)

## 2023-05-13 LAB — TSH: TSH: 0.779 u[IU]/mL (ref 0.350–4.500)

## 2023-05-13 LAB — ETHANOL: Alcohol, Ethyl (B): <10 mg/dL (ref <10)

## 2023-05-13 MED ORDER — LOPERAMIDE HCL 2 MG PO CAPS: 2.0000 mg | ORAL_CAPSULE | ORAL | Status: DC | PRN

## 2023-05-13 MED ORDER — NAPROXEN 500 MG PO TABS: 500.0000 mg | ORAL_TABLET | Freq: Two times a day (BID) | ORAL | Status: DC | PRN

## 2023-05-13 MED ORDER — THIAMINE MONONITRATE 100 MG PO TABS
100.0000 mg | ORAL_TABLET | Freq: Every day | ORAL | Status: DC
  Administered 2023-05-14: 100 mg via ORAL
  Filled 2023-05-13: qty 1

## 2023-05-13 MED ORDER — HYDROXYZINE HCL 25 MG PO TABS: 25.0000 mg | ORAL_TABLET | Freq: Four times a day (QID) | ORAL | Status: DC | PRN

## 2023-05-13 MED ORDER — THIAMINE HCL 100 MG/ML IJ SOLN
100.0000 mg | Freq: Once | INTRAMUSCULAR | Status: AC
  Administered 2023-05-13: 100 mg via INTRAMUSCULAR
  Filled 2023-05-13: qty 2

## 2023-05-13 MED ORDER — METHOCARBAMOL 500 MG PO TABS: 500.0000 mg | ORAL_TABLET | Freq: Three times a day (TID) | ORAL | Status: DC | PRN

## 2023-05-13 MED ORDER — LORAZEPAM 1 MG PO TABS: 1.0000 mg | ORAL_TABLET | Freq: Four times a day (QID) | ORAL | Status: DC | PRN

## 2023-05-13 MED ORDER — DICYCLOMINE HCL 20 MG PO TABS: 20.0000 mg | ORAL_TABLET | Freq: Four times a day (QID) | ORAL | Status: DC | PRN

## 2023-05-13 MED ORDER — ADULT MULTIVITAMIN W/MINERALS CH
1.0000 | ORAL_TABLET | Freq: Every day | ORAL | Status: DC
  Administered 2023-05-13 – 2023-05-14 (×2): 1 via ORAL
  Filled 2023-05-13 (×2): qty 1

## 2023-05-13 MED ORDER — ONDANSETRON 4 MG PO TBDP: 4.0000 mg | ORAL_TABLET | Freq: Four times a day (QID) | ORAL | Status: DC | PRN

## 2023-05-13 NOTE — ED Notes (Signed)
 Patient in the bedroom calm and sleeping. NAD. Look comfortable as well.  Will monitor for safety

## 2023-05-13 NOTE — ED Notes (Signed)
Patient asleep in bed at this time.  No distress

## 2023-05-13 NOTE — ED Notes (Signed)
 Patient is sleeping. Respirations equal and unlabored, skin warm and dry. No change in assessment or acuity. Routine safety checks conducted according to facility protocol. Will continue to monitor for safety.

## 2023-05-13 NOTE — ED Notes (Signed)
 Patient in the bedroom calm and sleeping. Respirations even and unlabored.  Will continue to monitor for safety.

## 2023-05-13 NOTE — ED Provider Notes (Signed)
 Behavioral Health Progress Note  Date and Time: 05/13/2023 9:01 AM Name: Edward Barr MRN:  [id number]  Subjective:  Edward Barr is a 53 year old male with no past psychiatric history who presented to the [facility] voluntarily seeking substance abuse treatment and detox from opiates, cocaine, and alcohol.    On assessment today, patient reports wanting to quit substance use due to being tired of being on substances as well as turning 53 this year.  He reports having a lot to live for especially with 9 children and 32 grandchildren.  He reports having a purpose and he is unable to live well while using some any substances.  He reports snorting or consuming cocaine and 6 cans of Bud Light daily.  He reports occasional opiate use but states that it is sporadic due to cost.  He denies present SI/HI/AVH.  He reports that he does not usually experience acute substance use withdrawal nor has he experienced any severe alcohol withdrawal including seizures or DTs.  He reports feeling fairly fatigued but this is secondary to regular cocaine use.  He is seeking residential rehab following completion of detox.  He denies feeling depressed nor anxious.  He declines psychotropic medications at this time.   Diagnosis:  Final diagnoses:  Cocaine abuse (HCC)  Substance induced mood disorder (HCC)  Opioid abuse (HCC)  Alcohol abuse  Polysubstance abuse (HCC)    Total Time spent with patient: 45 minutes  Past Psychiatric History: Denies; saw a therapist briefly when released from prison in 2000 Past Medical History: Denies Family History:  Family History  Problem Relation Age of Onset   Diabetes Mother    Heart Problems Mother    Heart Problems Father     Family Psychiatric  History: Father history of alcoholism.  Social History: He states that he is currently separated from his fiance Candace [phone] due to his drug use. He states that he has been living at a motel for the past 2 weeks. He denies  current medical problems. He denies taking prescribed or over-the-counter medications at this time. He states that he has 9 children ages 86, 11, 57, 37, 50, 47 28, 43, and 52. He reports having 32 grandkids.   Additional Social History:    Pain Medications: SEE MAR Prescriptions: SEE MAR Over the Counter: SEE MAR History of alcohol / drug use?: Yes Longest period of sobriety (when/how long): months at a time Negative Consequences of Use: Personal relationships, Surveyor, Quantity, Work / School Withdrawal Symptoms:  (Shakes and stomach aches) Name of Substance 1: Oxycodone  1 - Age of First Use: 53 years old 1 - Amount (size/oz): (5) 10mg  pills 1 - Frequency: daily 1 - Duration: on-going 1 - Last Use / Amount: 05/11/2023 -  (5) 10mg  pills 1 - Method of Aquiring: varies 1- Route of Use: oral Name of Substance 2: Cocaine 2 - Age of First Use: 53 years old 2 - Amount (size/oz): 1 gram 2 - Frequency: daily 2 - Duration: daily for 2 years 2 - Last Use / Amount: 05/11/2023 2 - Method of Aquiring: varies 2 - Route of Substance Use: snort                Sleep: Fair, improving  Appetite:  Fair, improving  Current Medications:  Current Facility-Administered Medications  Medication Dose Route Frequency Provider Last Rate Last Admin   acetaminophen  (TYLENOL ) tablet 650 mg  650 mg Oral Q6H PRN Ajibola, Ene A, NP       alum &  mag hydroxide-simeth (MAALOX/MYLANTA) 200-200-20 MG/5ML suspension 30 mL  30 mL Oral Q4H PRN Ajibola, Ene A, NP       dicyclomine  (BENTYL ) tablet 20 mg  20 mg Oral Q6H PRN Ajibola, Ene A, NP       hydrOXYzine  (ATARAX ) tablet 25 mg  25 mg Oral Q6H PRN Ajibola, Ene A, NP       loperamide  (IMODIUM ) capsule 2-4 mg  2-4 mg Oral PRN Ajibola, Ene A, NP       LORazepam  (ATIVAN ) tablet 1 mg  1 mg Oral Q6H PRN Ajibola, Ene A, NP       magnesium  hydroxide (MILK OF MAGNESIA) suspension 30 mL  30 mL Oral Daily PRN Ajibola, Ene A, NP       methocarbamol  (ROBAXIN ) tablet 500 mg  500 mg  Oral Q8H PRN Ajibola, Ene A, NP       multivitamin with minerals tablet 1 tablet  1 tablet Oral Daily Ajibola, Ene A, NP       naproxen  (NAPROSYN ) tablet 500 mg  500 mg Oral BID PRN Ajibola, Ene A, NP       ondansetron  (ZOFRAN -ODT) disintegrating tablet 4 mg  4 mg Oral Q6H PRN Ajibola, Ene A, NP       thiamine  (VITAMIN B1) injection 100 mg  100 mg Intramuscular Once Ajibola, Ene A, NP       [START ON 05/14/2023] thiamine  (VITAMIN B1) tablet 100 mg  100 mg Oral Daily Ajibola, Ene A, NP       traZODone  (DESYREL ) tablet 50 mg  50 mg Oral QHS PRN Ajibola, Ene A, NP       Current Outpatient Medications  Medication Sig Dispense Refill   hydrOXYzine  (ATARAX ) 25 MG tablet Take 1 tablet (25 mg total) by mouth 3 (three) times daily as needed for anxiety. 30 tablet 0   traZODone  (DESYREL ) 50 MG tablet Take 1 tablet (50 mg total) by mouth at bedtime as needed for sleep. 30 tablet 0    Labs  Lab Results:  Admission on 12/09/2022, Discharged on 12/10/2022  Component Date Value Ref Range Status   SARS Coronavirus 2 by RT PCR 12/09/2022 POSITIVE (A)  NEGATIVE Final   Comment: (NOTE) SARS-CoV-2 target nucleic acids are DETECTED.  The SARS-CoV-2 RNA is generally detectable in upper respiratory specimens during the acute phase of infection. Positive results are indicative of the presence of the identified virus, but do not rule out bacterial infection or co-infection with other pathogens not detected by the test. Clinical correlation with patient history and other diagnostic information is necessary to determine patient infection status. The expected result is Negative.  Fact Sheet for Patients: bloggercourse.com  Fact Sheet for Healthcare Providers: seriousbroker.it  This test is not yet approved or cleared by the [place]  FDA and  has been authorized for detection and/or diagnosis of SARS-CoV-2 by FDA under an Emergency Use Authorization  (EUA).  This EUA will remain in effect (meaning this test can be used) for the duration of  the COVID-19 declaration under Section 564(b)(1) of the A                          ct, 21 U.S.C. section 360bbb-3(b)(1), unless the authorization is terminated or revoked sooner.     Influenza A by PCR 12/09/2022 NEGATIVE  NEGATIVE Final   Influenza B by PCR 12/09/2022 NEGATIVE  NEGATIVE Final   Comment: (NOTE) The Xpert Xpress SARS-CoV-2/FLU/RSV plus assay is intended  as an aid in the diagnosis of influenza from Nasopharyngeal swab specimens and should not be used as a sole basis for treatment. Nasal washings and aspirates are unacceptable for Xpert Xpress SARS-CoV-2/FLU/RSV testing.  Fact Sheet for Patients: bloggercourse.com  Fact Sheet for Healthcare Providers: seriousbroker.it  This test is not yet approved or cleared by the [place]  FDA and has been authorized for detection and/or diagnosis of SARS-CoV-2 by FDA under an Emergency Use Authorization (EUA). This EUA will remain in effect (meaning this test can be used) for the duration of the COVID-19 declaration under Section 564(b)(1) of the Act, 21 U.S.C. section 360bbb-3(b)(1), unless the authorization is terminated or revoked.     Resp Syncytial Virus by PCR 12/09/2022 NEGATIVE  NEGATIVE Final   Comment: (NOTE) Fact Sheet for Patients: bloggercourse.com  Fact Sheet for Healthcare Providers: seriousbroker.it  This test is not yet approved or cleared by the [place]  FDA and has been authorized for detection and/or diagnosis of SARS-CoV-2 by FDA under an Emergency Use Authorization (EUA). This EUA will remain in effect (meaning this test can be used) for the duration of the COVID-19 declaration under Section 564(b)(1) of the Act, 21 U.S.C. section 360bbb-3(b)(1), unless the authorization is terminated  or revoked.  Performed at [facility], [address], [place], [place] [address]    WBC 12/09/2022 3.3 (L)  4.0 - 10.5 K/uL Final   RBC 12/09/2022 4.85  4.22 - 5.81 MIL/uL Final   Hemoglobin 12/09/2022 12.6 (L)  13.0 - 17.0 g/dL Final   HCT 91/90/7975 40.2  39.0 - 52.0 % Final   MCV 12/09/2022 82.9  80.0 - 100.0 fL Final   MCH 12/09/2022 26.0  26.0 - 34.0 pg Final   MCHC 12/09/2022 31.3  30.0 - 36.0 g/dL Final   RDW 91/90/7975 13.4  11.5 - 15.5 % Final   Platelets 12/09/2022 306  150 - 400 K/uL Final   nRBC 12/09/2022 0.0  0.0 - 0.2 % Final   Neutrophils Relative % 12/09/2022 69  % Final   Neutro Abs 12/09/2022 2.3  1.7 - 7.7 K/uL Final   Lymphocytes Relative 12/09/2022 17  % Final   Lymphs Abs 12/09/2022 0.6 (L)  0.7 - 4.0 K/uL Final   Monocytes Relative 12/09/2022 13  % Final   Monocytes Absolute 12/09/2022 0.4  0.1 - 1.0 K/uL Final   Eosinophils Relative 12/09/2022 1  % Final   Eosinophils Absolute 12/09/2022 0.0  0.0 - 0.5 K/uL Final   Basophils Relative 12/09/2022 0  % Final   Basophils Absolute 12/09/2022 0.0  0.0 - 0.1 K/uL Final   Immature Granulocytes 12/09/2022 0  % Final   Abs Immature Granulocytes 12/09/2022 0.01  0.00 - 0.07 K/uL Final   Performed at [facility], [address] [place], [place] [address]   Sodium 12/09/2022 135  135 - 145 mmol/L Final   Potassium 12/09/2022 3.4 (L)  3.5 - 5.1 mmol/L Final   Chloride 12/09/2022 102  98 - 111 mmol/L Final   CO2 12/09/2022 22  22 - 32 mmol/L Final   Glucose, Bld 12/09/2022 100 (H)  70 - 99 mg/dL Final   Glucose reference range applies only to samples taken after fasting for at least 8 hours.   BUN 12/09/2022 10  6 - 20 mg/dL Final   Creatinine, Ser 12/09/2022 0.95  0.61 - 1.24 mg/dL Final   Calcium 91/90/7975 9.0  8.9 - 10.3 mg/dL Final   Total Protein 91/90/7975 7.6  6.5 - 8.1 g/dL Final   Albumin 91/90/7975 4.1  3.5 - 5.0 g/dL Final   AST 91/90/7975 19  15 - 41 U/L Final    ALT 12/09/2022 18  0 - 44 U/L Final   Alkaline Phosphatase 12/09/2022 77  38 - 126 U/L Final   Total Bilirubin 12/09/2022 0.4  0.3 - 1.2 mg/dL Final   GFR, Estimated 12/09/2022 >60  >60 mL/min Final   Comment: (NOTE) Calculated using the CKD-EPI Creatinine Equation (2021)    Anion gap 12/09/2022 11  5 - 15 Final   Performed at [facility], [address], [place], [place] [address]   D-Dimer, Earleen 12/09/2022 <0.27  0.00 - 0.50 ug/mL-FEU Final   Comment: (NOTE) At the manufacturer cut-off value of 0.5 g/mL FEU, this assay has a negative predictive value of 95-100%.This assay is intended for use in conjunction with a clinical pretest probability (PTP) assessment model to exclude pulmonary embolism (PE) and deep venous thrombosis (DVT) in outpatients suspected of PE or DVT. Results should be correlated with clinical presentation. Performed at [facility], [address] [place], [place] [address]    Troponin I (High Sensitivity) 12/09/2022 10  <18 ng/L Final   Comment: (NOTE) Elevated high sensitivity troponin I (hsTnI) values and significant  changes across serial measurements may suggest ACS but many other  chronic and acute conditions are known to elevate hsTnI results.  Refer to the Links section for chest pain algorithms and additional  guidance. Performed at [facility], [address], [place], [place] [address]    Troponin I (High Sensitivity) 12/09/2022 11  <18 ng/L Final   Comment: (NOTE) Elevated high sensitivity troponin I (hsTnI) values and significant  changes across serial measurements may suggest ACS but many other  chronic and acute conditions are known to elevate hsTnI results.  Refer to the Links section for chest pain algorithms and additional  guidance. Performed at [facility], [address], [place], [place] [address]    HIV-1 P24 Antigen - HIV24 12/09/2022 NON REACTIVE   NON REACTIVE Final   Comment: (NOTE) Detection of p24 may be inhibited by biotin in the sample, causing false negative results in acute infection.    HIV 1/2 Antibodies 12/09/2022 NON REACTIVE  NON REACTIVE Final   Interpretation (HIV Ag Ab) 12/09/2022 A non reactive test result means that HIV 1 or HIV 2 antibodies and HIV 1 p24 antigen were not detected in the specimen.   Final   Comment: RESULT CALLED TO, READ BACK BY AND VERIFIED WITH: GRAY,S AT 2309 ON 12/09/22 BY LUZOLOP Performed at [facility], [address], [place], [place] [address]     Blood Alcohol level:  Lab Results  Component Value Date   ETH <10 10/20/2022    Metabolic Disorder Labs: Lab Results  Component Value Date   HGBA1C 5.3 10/20/2022   MPG 105.41 10/20/2022   No results found for: PROLACTIN Lab Results  Component Value Date   CHOL 217 (H) 10/20/2022   TRIG 50 10/20/2022   HDL 67 10/20/2022   CHOLHDL 3.2 10/20/2022   VLDL 10 10/20/2022   LDLCALC 140 (H) 10/20/2022    Therapeutic Lab Levels: No results found for: LITHIUM No results found for: VALPROATE No results found for: CBMZ  Physical Findings   PHQ2-9    Flowsheet Row ED from 05/12/2023 in [facility] ED from 10/20/2022 in [facility]  PHQ-2 Total Score 6 0  PHQ-9 Total Score 16 2      Flowsheet Row ED from 05/12/2023 in [facility] Most recent reading at 05/12/2023 11:28 PM ED from 05/12/2023 in [facility] Most recent reading at 05/12/2023  8:09 PM ED from 12/09/2022 in [facility] Emergency Department at [facility] Most recent reading at 12/09/2022  5:58 PM  C-SSRS RISK CATEGORY No Risk No Risk No Risk        Musculoskeletal  Strength & Muscle Tone: within normal limits Gait & Station: normal Patient leans: N/A  Psychiatric Specialty Exam  Presentation  General Appearance:   Appropriate for Environment  Eye Contact: Good  Speech: Clear and Coherent  Speech Volume: Normal  Handedness: Right   Mood and Affect  Mood: Depressed  Affect: Tearful   Thought Process  Thought Processes: Coherent  Descriptions of Associations:Intact  Orientation:Full (Time, Place and Person)  Thought Content:WDL     Hallucinations: denies Ideas of Reference:None  Suicidal Thoughts:Suicidal Thoughts: No  Homicidal Thoughts:Homicidal Thoughts: No   Sensorium  Memory: Immediate Good; Recent Good; Remote Good  Judgment: Fair  Insight: Good   Executive Functions  Concentration: Good  Attention Span: Good  Recall: Good  Fund of Knowledge: Good  Language: Good   Psychomotor Activity  Psychomotor Activity: Psychomotor Activity: Normal   Assets  Assets: Communication Skills; Desire for Improvement; Housing; Physical Health; Social Support   Sleep  Sleep: Sleep: Good Number of Hours of Sleep: 12   Nutritional Assessment (For OBS and FBC admissions only) Has the patient had a weight loss or gain of 10 pounds or more in the last 3 months?: No Has the patient had a decrease in food intake/or appetite?: No Does the patient have dental problems?: No Does the patient have eating habits or behaviors that may be indicators of an eating disorder including binging or inducing vomiting?: No Has the patient recently lost weight without trying?: 0 Has the patient been eating poorly because of a decreased appetite?: 0 Malnutrition Screening Tool Score: 0    Physical Exam  Physical Exam Vitals reviewed.  Constitutional:      General: He is not in acute distress.    Appearance: He is not toxic-appearing.  HENT:     Head: Normocephalic and atraumatic.     Mouth/Throat:     Mouth: Mucous membranes are moist.     Pharynx: Oropharynx is clear.  Pulmonary:     Effort: Pulmonary effort is normal.  Neurological:     General: No focal  deficit present.     Mental Status: He is alert and oriented to person, place, and time.     Gait: Gait normal.   Review of Systems  Cardiovascular:  Negative for chest pain and palpitations.  Gastrointestinal:  Negative for abdominal pain, diarrhea, nausea and vomiting.  Musculoskeletal:  Positive for back pain.  Neurological:  Negative for dizziness and headaches.   Blood pressure 105/70, pulse 70, temperature 97.9 F (36.6 C), temperature source Oral, resp. rate 20, SpO2 99%. There is no height or weight on file to calculate BMI.  Treatment Plan Summary: Daily contact with patient to assess and evaluate symptoms and progress in treatment and Medication management  Sueo Cullen is a 53 year old male with no past psychiatric history who was admitted to the [facility] seeking substance abuse treatment and detox. Patient is motivated for residential substance use treatment. He declines psychotropics today. LCSW assisting with dispo planning.  Will discuss naltrexone or other MAT for alcohol use disorder and opiate use disorder once we have obtained labs for him.   #Cocaine Use Disorder #Alcohol Use Disorder #Opiate Use Disorder -Continue CIWA monitoring -Continue Ativan  1 mg PRN for CIWA >10 -Continue Zofran  4 mg every 6 hours as needed for nausea or vomiting. -Continue Imodium  2 to 4 mg as needed for diarrhea or loose stools for 72 hours.  -Continue hydroxyzine  25 mg every 6 hours as needed for anxiety -Continue thiamine  100 mg daily. -Continue multivitamin with minerals daily.   Dispo: seeking residential rehab. LCSW to assist with dispo  Prentice Espy, MD 05/13/2023 9:01 AM

## 2023-05-13 NOTE — ED Notes (Signed)
 Patient's blood work not drawn upon admission because [facility] had no couriers d/t the snow weather. Samples will need to be drawn when couriers are again available.

## 2023-05-13 NOTE — ED Notes (Signed)
 Patient is asleep in bed without distress or complaint.  No evidence of withdrawal at this time.

## 2023-05-13 NOTE — ED Notes (Signed)
 Patient attended group and ate lunch.  No distress or complaint.CIWA= 0  Sitting in day room presently.

## 2023-05-13 NOTE — Group Note (Signed)
 Group Topic: Positive Affirmations  Group Date: 05/13/2023 Start Time: 1205 End Time: 1225 Facilitators: Stanly Stabile, RN  Department: [facility]  Number of Participants: 4  Group Focus: coping skills Treatment Modality:  Behavior Modification Therapy Interventions utilized were patient education Purpose: enhance coping skills, explore maladaptive thinking, express feelings, express irrational fears, improve communication skills, increase insight, regain self-worth, reinforce self-care, and relapse prevention strategies  Name: Edward Barr Date of Birth: Sep 08, 1970  MR: [id number]    Level of Participation: minimal Quality of Participation: cooperative Interactions with others: somnolent Mood/Affect: somnolent Triggers (if applicable):   Cognition: processing slowly Progress: Minimal Response:   Plan: follow-up needed  Patients Problems:  Patient Active Problem List   Diagnosis Date Noted   Polysubstance abuse (HCC) 05/12/2023   PTSD (post-traumatic stress disorder) 10/24/2022   Cocaine abuse (HCC) 10/24/2022   Opiate use 10/24/2022   Substance induced mood disorder (HCC) 10/24/2022   Substance use disorder 10/20/2022

## 2023-05-14 DIAGNOSIS — F1410 Cocaine abuse, uncomplicated: Secondary | ICD-10-CM | POA: Diagnosis not present

## 2023-05-14 DIAGNOSIS — F1110 Opioid abuse, uncomplicated: Secondary | ICD-10-CM | POA: Diagnosis not present

## 2023-05-14 DIAGNOSIS — F1994 Other psychoactive substance use, unspecified with psychoactive substance-induced mood disorder: Secondary | ICD-10-CM | POA: Diagnosis not present

## 2023-05-14 DIAGNOSIS — F1010 Alcohol abuse, uncomplicated: Secondary | ICD-10-CM | POA: Diagnosis not present

## 2023-05-14 NOTE — ED Provider Notes (Addendum)
 [facility]BC/OBS ASAP Discharge Summary  Date and Time: 05/14/2023 1:50 PM  Name: Edward Barr  MRN:  [id number]   Discharge Diagnoses:  Final diagnoses:  Cocaine abuse (HCC)  Substance induced mood disorder (HCC)  Opioid abuse (HCC)  Alcohol abuse  Polysubstance abuse (HCC)    Subjective: Patient seen and assessed on unit. Reports wanting to discharge. Denies SI/HI/AVH. Denies feelings of withdrawal.   Stay Summary by Problem List Edward Barr is a 53 year old male with history of alcohol use disorder, cocaine use disorder, and opiate use disorder who was admitted to the [facility] seeking substance abuse treatment and detox. Hospital course is detailed below:  #Cocaine Use Disorder #Alcohol Use Disorder #Opiate Use Disorder Patient was placed on CIWA and monitored for any acute withdrawal symptoms. CIWA were 0 during entirety of brief hospitalization. No acute safety concerns were identified. He opted to discharge as he was not experiencing any significant withdrawal symptoms. He refused any MAT for alcohol use disorder. He did not feel he had any need for antidepressant.     Total Time spent with patient: 30 minutes  Past Psychiatric History: cocaine use, alcohol use, opiate use Past Medical History:  Past Medical History:  Diagnosis Date   DVT (deep venous thrombosis) (HCC) 2020   Hypertension    No past surgical history on file. Family History:  Family History  Problem Relation Age of Onset   Diabetes Mother    Heart Problems Mother    Heart Problems Father    Family Psychiatric History: Father history of alcoholism  Social History:  Social History   Substance and Sexual Activity  Alcohol Use No     Social History   Substance and Sexual Activity  Drug Use Yes   Types: Cocaine   Comment: snorts cocaine    Social History   Socioeconomic History   Marital status: Single    Spouse name: Not on file   Number of children: Not on file   Years of education: Not on file    Highest education level: Not on file  Occupational History   Not on file  Tobacco Use   Smoking status: Never   Smokeless tobacco: Never  Substance and Sexual Activity   Alcohol use: No   Drug use: Yes    Types: Cocaine    Comment: snorts cocaine   Sexual activity: Not on file  Other Topics Concern   Not on file  Social History Narrative   ** Merged History Encounter **       Social Drivers of Health   Financial Resource Strain: Not on file  Food Insecurity: Food Insecurity Present (05/12/2023)   Hunger Vital Sign    Worried About Running Out of Food in the Last Year: Often true    Ran Out of Food in the Last Year: Sometimes true  Transportation Needs: No Transportation Needs (05/12/2023)   PRAPARE - Administrator, Civil Service (Medical): No    Lack of Transportation (Non-Medical): No  Physical Activity: Not on file  Stress: Not on file  Social Connections: Not on file   SDOH:  SDOH Screenings   Food Insecurity: Food Insecurity Present (05/12/2023)  Housing: Low Risk  (10/20/2022)  Transportation Needs: No Transportation Needs (05/12/2023)  Utilities: Not At Risk (05/12/2023)  Depression (PHQ2-9): Low Risk  (05/14/2023)  Recent Concern: Depression (PHQ2-9) - High Risk (05/12/2023)  Tobacco Use: Low Risk  (12/09/2022)    Tobacco Cessation:  N/A, patient does not currently use tobacco products  Current Medications:  Current Facility-Administered Medications  Medication Dose Route Frequency Provider Last Rate Last Admin   acetaminophen  (TYLENOL ) tablet 650 mg  650 mg Oral Q6H PRN Ajibola, Ene A, NP       alum & mag hydroxide-simeth (MAALOX/MYLANTA) 200-200-20 MG/5ML suspension 30 mL  30 mL Oral Q4H PRN Ajibola, Ene A, NP       dicyclomine  (BENTYL ) tablet 20 mg  20 mg Oral Q6H PRN Ajibola, Ene A, NP       hydrOXYzine  (ATARAX ) tablet 25 mg  25 mg Oral Q6H PRN Ajibola, Ene A, NP       loperamide  (IMODIUM ) capsule 2-4 mg  2-4 mg Oral PRN Ajibola, Ene A, NP        LORazepam  (ATIVAN ) tablet 1 mg  1 mg Oral Q6H PRN Ajibola, Ene A, NP       magnesium  hydroxide (MILK OF MAGNESIA) suspension 30 mL  30 mL Oral Daily PRN Ajibola, Ene A, NP       methocarbamol  (ROBAXIN ) tablet 500 mg  500 mg Oral Q8H PRN Ajibola, Ene A, NP       multivitamin with minerals tablet 1 tablet  1 tablet Oral Daily Ajibola, Ene A, NP   1 tablet at 05/14/23 9078   naproxen  (NAPROSYN ) tablet 500 mg  500 mg Oral BID PRN Ajibola, Ene A, NP       ondansetron  (ZOFRAN -ODT) disintegrating tablet 4 mg  4 mg Oral Q6H PRN Ajibola, Ene A, NP       thiamine  (VITAMIN B1) tablet 100 mg  100 mg Oral Daily Ajibola, Ene A, NP   100 mg at 05/14/23 9078   traZODone  (DESYREL ) tablet 50 mg  50 mg Oral QHS PRN Ajibola, Ene A, NP       No current outpatient medications on file.    PTA Medications: (Not in a hospital admission)       05/14/2023    1:22 PM 05/12/2023    9:33 PM 10/24/2022   10:22 AM  Depression screen PHQ 2/9  Decreased Interest 0 3 0  Down, Depressed, Hopeless 0 3 0  PHQ - 2 Score 0 6 0  Altered sleeping 1 2   Tired, decreased energy 1 2   Change in appetite 0 0   Feeling bad or failure about yourself  0 3   Trouble concentrating 0 2   Moving slowly or fidgety/restless 0 1   Suicidal thoughts 0 0   PHQ-9 Score 2 16   Difficult doing work/chores  Very difficult     Flowsheet Row ED from 05/12/2023 in [facility] Most recent reading at 05/12/2023 11:28 PM ED from 05/12/2023 in [facility] Most recent reading at 05/12/2023  8:09 PM ED from 12/09/2022 in [facility] at [facility] Most recent reading at 12/09/2022  5:58 PM  C-SSRS RISK CATEGORY No Risk No Risk No Risk       Musculoskeletal  Strength & Muscle Tone: within normal limits Gait & Station: normal Patient leans: N/A  Psychiatric Specialty Exam  Presentation  General Appearance:  Appropriate for Environment; Casual   Eye  Contact: Good   Speech: Clear and Coherent; Normal Rate   Speech Volume: Normal   Handedness: Right    Mood and Affect  Mood: Euthymic   Affect: Appropriate; Congruent    Thought Process  Thought Processes: Coherent; Goal Directed; Linear   Descriptions of Associations:Intact   Orientation:Full (Time, Place and  Person)   Thought Content:Logical      Hallucinations:Hallucinations: None   Ideas of Reference:None   Suicidal Thoughts:Suicidal Thoughts: No   Homicidal Thoughts:Homicidal Thoughts: No    Sensorium  Memory: Remote Good   Judgment: Good   Insight: Good    Executive Functions  Concentration: Good   Attention Span: Good   Recall: Good   Fund of Knowledge: Good   Language: Good    Psychomotor Activity  Psychomotor Activity:Psychomotor Activity: Normal    Assets  Assets: Communication Skills; Resilience; Desire for Improvement    Sleep  Sleep:Sleep: Good    No data recorded   Physical Exam  Physical Exam ROS Blood pressure 123/71, pulse 66, temperature 97.7 F (36.5 C), temperature source Oral, resp. rate 18, SpO2 100%. There is no height or weight on file to calculate BMI.  Demographic Factors:  Male and Low socioeconomic status  Loss Factors: NA  Historical Factors: Impulsivity  Risk Reduction Factors:   Positive social support, Positive therapeutic relationship, and Positive coping skills or problem solving skills  Continued Clinical Symptoms:  Alcohol/Substance Abuse/Dependencies  Cognitive Features That Contribute To Risk:  Closed-mindedness    Suicide Risk:  Minimal: No identifiable suicidal ideation.  Patients presenting with no risk factors but with morbid ruminations; may be classified as minimal risk based on the severity of the depressive symptoms  Plan Of Care/Follow-up recommendations:   Follow-up recommendations:   Activity:  as tolerated Diet:  heart healthy    Comments:  Prescriptions were given at discharge.  Patient is agreeable with the discharge plan.  Patient was given an opportunity to ask questions.  Patient appears to feel comfortable with discharge and denies any current suicidal or homicidal thoughts.     Prentice Espy, MD 05/14/2023, 1:50 PM

## 2023-05-14 NOTE — ED Notes (Addendum)
 Patient in the bedroom calm and sleeping. Respirations even and unlabored.  Will continue to monitor for safety.

## 2023-05-14 NOTE — ED Provider Notes (Signed)
 Behavioral Health Progress Note  Date and Time: 05/14/2023 10:52 AM Name: Edward Barr MRN:  [id number]  Subjective:  Edward Barr is a 53 year old male with no past psychiatric history who presented to the [facility] voluntarily seeking substance abuse treatment and detox from opiates, cocaine, and alcohol.    On assessment today, patient reports no withdrawal symptoms at this time. Eating and sleeping well. Remains interested in going to residential rehab but also debating if he could just go to sober living community. Advised he go to residential rehab. He stated he would consider it.    Diagnosis:  Final diagnoses:  Cocaine abuse (HCC)  Substance induced mood disorder (HCC)  Opioid abuse (HCC)  Alcohol abuse  Polysubstance abuse (HCC)    Total Time spent with patient: 45 minutes  Past Psychiatric History: Denies; saw a therapist briefly when released from prison in 2000 Past Medical History: Denies Family History:  Family History  Problem Relation Age of Onset   Diabetes Mother    Heart Problems Mother    Heart Problems Father     Family Psychiatric  History: Father history of alcoholism.  Social History: He states that he is currently separated from his fiance Candace [phone] due to his drug use. He states that he has been living at a motel for the past 2 weeks. He denies current medical problems. He denies taking prescribed or over-the-counter medications at this time. He states that he has 9 children ages 65, 32, 24, 78, 64, 56 3, 26, and 23. He reports having 32 grandkids.   Additional Social History:    Pain Medications: SEE MAR Prescriptions: SEE MAR Over the Counter: SEE MAR History of alcohol / drug use?: Yes Longest period of sobriety (when/how long): months at a time Negative Consequences of Use: Personal relationships, Surveyor, Quantity, Work / School Withdrawal Symptoms:  (Shakes and stomach aches) Name of Substance 1: Oxycodone  1 - Age of First Use: 53 years  old 1 - Amount (size/oz): (5) 10mg  pills 1 - Frequency: daily 1 - Duration: on-going 1 - Last Use / Amount: 05/11/2023 -  (5) 10mg  pills 1 - Method of Aquiring: varies 1- Route of Use: oral Name of Substance 2: Cocaine 2 - Age of First Use: 53 years old 2 - Amount (size/oz): 1 gram 2 - Frequency: daily 2 - Duration: daily for 2 years 2 - Last Use / Amount: 05/11/2023 2 - Method of Aquiring: varies 2 - Route of Substance Use: snort                Sleep: Fair, improving  Appetite:  Fair, improving  Current Medications:  Current Facility-Administered Medications  Medication Dose Route Frequency Provider Last Rate Last Admin   acetaminophen  (TYLENOL ) tablet 650 mg  650 mg Oral Q6H PRN Ajibola, Ene A, NP       alum & mag hydroxide-simeth (MAALOX/MYLANTA) 200-200-20 MG/5ML suspension 30 mL  30 mL Oral Q4H PRN Ajibola, Ene A, NP       dicyclomine  (BENTYL ) tablet 20 mg  20 mg Oral Q6H PRN Ajibola, Ene A, NP       hydrOXYzine  (ATARAX ) tablet 25 mg  25 mg Oral Q6H PRN Ajibola, Ene A, NP       loperamide  (IMODIUM ) capsule 2-4 mg  2-4 mg Oral PRN Ajibola, Ene A, NP       LORazepam  (ATIVAN ) tablet 1 mg  1 mg Oral Q6H PRN Ajibola, Ene A, NP       magnesium  hydroxide (MILK  OF MAGNESIA) suspension 30 mL  30 mL Oral Daily PRN Ajibola, Ene A, NP       methocarbamol  (ROBAXIN ) tablet 500 mg  500 mg Oral Q8H PRN Ajibola, Ene A, NP       multivitamin with minerals tablet 1 tablet  1 tablet Oral Daily Ajibola, Ene A, NP   1 tablet at 05/14/23 9078   naproxen  (NAPROSYN ) tablet 500 mg  500 mg Oral BID PRN Ajibola, Ene A, NP       ondansetron  (ZOFRAN -ODT) disintegrating tablet 4 mg  4 mg Oral Q6H PRN Ajibola, Ene A, NP       thiamine  (VITAMIN B1) tablet 100 mg  100 mg Oral Daily Ajibola, Ene A, NP   100 mg at 05/14/23 9078   traZODone  (DESYREL ) tablet 50 mg  50 mg Oral QHS PRN Ajibola, Ene A, NP       No current outpatient medications on file.    Labs  Lab Results:  Admission on 05/12/2023   Component Date Value Ref Range Status   WBC 05/13/2023 5.4  4.0 - 10.5 K/uL Final   RBC 05/13/2023 5.13  4.22 - 5.81 MIL/uL Final   Hemoglobin 05/13/2023 13.1  13.0 - 17.0 g/dL Final   HCT 98/88/7974 42.2  39.0 - 52.0 % Final   MCV 05/13/2023 82.3  80.0 - 100.0 fL Final   MCH 05/13/2023 25.5 (L)  26.0 - 34.0 pg Final   MCHC 05/13/2023 31.0  30.0 - 36.0 g/dL Final   RDW 98/88/7974 14.3  11.5 - 15.5 % Final   Platelets 05/13/2023 343  150 - 400 K/uL Final   nRBC 05/13/2023 0.0  0.0 - 0.2 % Final   Neutrophils Relative % 05/13/2023 39  % Final   Neutro Abs 05/13/2023 2.1  1.7 - 7.7 K/uL Final   Lymphocytes Relative 05/13/2023 43  % Final   Lymphs Abs 05/13/2023 2.4  0.7 - 4.0 K/uL Final   Monocytes Relative 05/13/2023 6  % Final   Monocytes Absolute 05/13/2023 0.3  0.1 - 1.0 K/uL Final   Eosinophils Relative 05/13/2023 11  % Final   Eosinophils Absolute 05/13/2023 0.6 (H)  0.0 - 0.5 K/uL Final   Basophils Relative 05/13/2023 1  % Final   Basophils Absolute 05/13/2023 0.0  0.0 - 0.1 K/uL Final   Immature Granulocytes 05/13/2023 0  % Final   Abs Immature Granulocytes 05/13/2023 0.02  0.00 - 0.07 K/uL Final   Performed at [facility], [address] [place], [place] [address]   Sodium 05/13/2023 137  135 - 145 mmol/L Final   Potassium 05/13/2023 3.9  3.5 - 5.1 mmol/L Final   Chloride 05/13/2023 102  98 - 111 mmol/L Final   CO2 05/13/2023 28  22 - 32 mmol/L Final   Glucose, Bld 05/13/2023 102 (H)  70 - 99 mg/dL Final   Glucose reference range applies only to samples taken after fasting for at least 8 hours.   BUN 05/13/2023 13  6 - 20 mg/dL Final   Creatinine, Ser 05/13/2023 0.88  0.61 - 1.24 mg/dL Final   Calcium 98/88/7974 9.4  8.9 - 10.3 mg/dL Final   Total Protein 98/88/7974 6.8  6.5 - 8.1 g/dL Final   Albumin 98/88/7974 3.4 (L)  3.5 - 5.0 g/dL Final   AST 98/88/7974 55 (H)  15 - 41 U/L Final   ALT 05/13/2023 60 (H)  0 - 44 U/L Final   Alkaline Phosphatase 05/13/2023 79   38 -  126 U/L Final   Total Bilirubin 05/13/2023 0.3  0.0 - 1.2 mg/dL Final   GFR, Estimated 05/13/2023 >60  >60 mL/min Final   Comment: (NOTE) Calculated using the CKD-EPI Creatinine Equation (2021)    Anion gap 05/13/2023 7  5 - 15 Final   Performed at [facility], [address], [place], [place] [address]   Hgb A1c MFr Bld 05/13/2023 5.8 (H)  4.8 - 5.6 % Final   Comment: (NOTE) Pre diabetes:          5.7%-6.4%  Diabetes:              >6.4%  Glycemic control for   <7.0% adults with diabetes    Mean Plasma Glucose 05/13/2023 119.76  mg/dL Final   Performed at [facility], [address], [place], [place] [address]   Alcohol, Ethyl (B) 05/13/2023 <10  <10 mg/dL Final   Comment: (NOTE) Lowest detectable limit for serum alcohol is 10 mg/dL.  For medical purposes only. Performed at [facility], [address], [place], [place] [address]    Cholesterol 05/13/2023 196  0 - 200 mg/dL Final   Triglycerides 98/88/7974 155 (H)  <150 mg/dL Final   HDL 98/88/7974 61  >40 mg/dL Final   Total CHOL/HDL Ratio 05/13/2023 3.2  RATIO Final   VLDL 05/13/2023 31  0 - 40 mg/dL Final   LDL Cholesterol 05/13/2023 104 (H)  0 - 99 mg/dL Final   Comment:        Total Cholesterol/HDL:CHD Risk Coronary Heart Disease Risk Table                     Men   Women  1/2 Average Risk   3.4   3.3  Average Risk       5.0   4.4  2 X Average Risk   9.6   7.1  3 X Average Risk  23.4   11.0        Use the calculated Patient Ratio above and the CHD Risk Table to determine the patient's CHD Risk.        ATP III CLASSIFICATION (LDL):  <100     mg/dL   Optimal  899-870  mg/dL   Near or Above                    Optimal  130-159  mg/dL   Borderline  839-810  mg/dL   High  >809     mg/dL   Very High Performed at [facility], [address], [place], [place] [address]    POC Marijuana UR 05/13/2023 Positive (A)   Final   POC Oxycodone  UR 05/13/2023 None Detected   Final   POC  Methadone UR 05/13/2023 None Detected   Final   POC Morphine 05/13/2023 None Detected   Final   POC Methamphetamine UR 05/13/2023 None Detected   Final   POC Cocaine UR 05/13/2023 Positive (A)   Final   POC Oxazepam (BZO) 05/13/2023 None Detected   Final   POC Buprenorphine (BUP) 05/13/2023 None Detected   Final   POC Secobarbital (BAR) 05/13/2023 None Detected   Final   POC Amphetamine UR 05/13/2023 None Detected   Final   TSH 05/13/2023 0.779  0.350 - 4.500 uIU/mL Final   Comment: Performed by a 3rd Generation assay with a functional sensitivity of <=0.01 uIU/mL. Performed at [facility], [address] [place],  [place] [address]   Admission on 12/09/2022, Discharged on 12/10/2022  Component Date Value Ref Range Status   SARS Coronavirus 2 by RT PCR 12/09/2022 POSITIVE (A)  NEGATIVE Final   Comment: (NOTE) SARS-CoV-2 target nucleic acids are DETECTED.  The SARS-CoV-2 RNA is generally detectable in upper respiratory specimens during the acute phase of infection. Positive results are indicative of the presence of the identified virus, but do not rule out bacterial infection or co-infection with other pathogens not detected by the test. Clinical correlation with patient history and other diagnostic information is necessary to determine patient infection status. The expected result is Negative.  Fact Sheet for Patients: bloggercourse.com  Fact Sheet for Healthcare Providers: seriousbroker.it  This test is not yet approved or cleared by the [place]  FDA and  has been authorized for detection and/or diagnosis of SARS-CoV-2 by FDA under an Emergency Use Authorization (EUA).  This EUA will remain in effect (meaning this test can be used) for the duration of  the COVID-19 declaration under Section 564(b)(1) of the A                          ct, 21 U.S.C. section 360bbb-3(b)(1), unless the authorization is terminated or  revoked sooner.     Influenza A by PCR 12/09/2022 NEGATIVE  NEGATIVE Final   Influenza B by PCR 12/09/2022 NEGATIVE  NEGATIVE Final   Comment: (NOTE) The Xpert Xpress SARS-CoV-2/FLU/RSV plus assay is intended as an aid in the diagnosis of influenza from Nasopharyngeal swab specimens and should not be used as a sole basis for treatment. Nasal washings and aspirates are unacceptable for Xpert Xpress SARS-CoV-2/FLU/RSV testing.  Fact Sheet for Patients: bloggercourse.com  Fact Sheet for Healthcare Providers: seriousbroker.it  This test is not yet approved or cleared by the [place]  FDA and has been authorized for detection and/or diagnosis of SARS-CoV-2 by FDA under an Emergency Use Authorization (EUA). This EUA will remain in effect (meaning this test can be used) for the duration of the COVID-19 declaration under Section 564(b)(1) of the Act, 21 U.S.C. section 360bbb-3(b)(1), unless the authorization is terminated or revoked.     Resp Syncytial Virus by PCR 12/09/2022 NEGATIVE  NEGATIVE Final   Comment: (NOTE) Fact Sheet for Patients: bloggercourse.com  Fact Sheet for Healthcare Providers: seriousbroker.it  This test is not yet approved or cleared by the [place]  FDA and has been authorized for detection and/or diagnosis of SARS-CoV-2 by FDA under an Emergency Use Authorization (EUA). This EUA will remain in effect (meaning this test can be used) for the duration of the COVID-19 declaration under Section 564(b)(1) of the Act, 21 U.S.C. section 360bbb-3(b)(1), unless the authorization is terminated or revoked.  Performed at [facility], [address] [place], [place] [address]    WBC 12/09/2022 3.3 (L)  4.0 - 10.5 K/uL Final   RBC 12/09/2022 4.85  4.22 - 5.81 MIL/uL Final   Hemoglobin 12/09/2022 12.6 (L)  13.0 - 17.0 g/dL Final   HCT  91/90/7975 40.2  39.0 - 52.0 % Final   MCV 12/09/2022 82.9  80.0 - 100.0 fL Final   MCH 12/09/2022 26.0  26.0 - 34.0 pg Final   MCHC 12/09/2022 31.3  30.0 - 36.0 g/dL Final   RDW 91/90/7975 13.4  11.5 - 15.5 % Final   Platelets 12/09/2022 306  150 - 400 K/uL Final   nRBC 12/09/2022 0.0  0.0 - 0.2 % Final   Neutrophils Relative %  12/09/2022 69  % Final   Neutro Abs 12/09/2022 2.3  1.7 - 7.7 K/uL Final   Lymphocytes Relative 12/09/2022 17  % Final   Lymphs Abs 12/09/2022 0.6 (L)  0.7 - 4.0 K/uL Final   Monocytes Relative 12/09/2022 13  % Final   Monocytes Absolute 12/09/2022 0.4  0.1 - 1.0 K/uL Final   Eosinophils Relative 12/09/2022 1  % Final   Eosinophils Absolute 12/09/2022 0.0  0.0 - 0.5 K/uL Final   Basophils Relative 12/09/2022 0  % Final   Basophils Absolute 12/09/2022 0.0  0.0 - 0.1 K/uL Final   Immature Granulocytes 12/09/2022 0  % Final   Abs Immature Granulocytes 12/09/2022 0.01  0.00 - 0.07 K/uL Final   Performed at [facility], [address] [place], [place] [address]   Sodium 12/09/2022 135  135 - 145 mmol/L Final   Potassium 12/09/2022 3.4 (L)  3.5 - 5.1 mmol/L Final   Chloride 12/09/2022 102  98 - 111 mmol/L Final   CO2 12/09/2022 22  22 - 32 mmol/L Final   Glucose, Bld 12/09/2022 100 (H)  70 - 99 mg/dL Final   Glucose reference range applies only to samples taken after fasting for at least 8 hours.   BUN 12/09/2022 10  6 - 20 mg/dL Final   Creatinine, Ser 12/09/2022 0.95  0.61 - 1.24 mg/dL Final   Calcium 91/90/7975 9.0  8.9 - 10.3 mg/dL Final   Total Protein 91/90/7975 7.6  6.5 - 8.1 g/dL Final   Albumin 91/90/7975 4.1  3.5 - 5.0 g/dL Final   AST 91/90/7975 19  15 - 41 U/L Final   ALT 12/09/2022 18  0 - 44 U/L Final   Alkaline Phosphatase 12/09/2022 77  38 - 126 U/L Final   Total Bilirubin 12/09/2022 0.4  0.3 - 1.2 mg/dL Final   GFR, Estimated 12/09/2022 >60  >60 mL/min Final   Comment: (NOTE) Calculated using the CKD-EPI Creatinine Equation  (2021)    Anion gap 12/09/2022 11  5 - 15 Final   Performed at [facility], [address], [place], [place] [address]   D-Dimer, Earleen 12/09/2022 <0.27  0.00 - 0.50 ug/mL-FEU Final   Comment: (NOTE) At the manufacturer cut-off value of 0.5 g/mL FEU, this assay has a negative predictive value of 95-100%.This assay is intended for use in conjunction with a clinical pretest probability (PTP) assessment model to exclude pulmonary embolism (PE) and deep venous thrombosis (DVT) in outpatients suspected of PE or DVT. Results should be correlated with clinical presentation. Performed at [facility], [address], [place], [place] [address]    Troponin I (High Sensitivity) 12/09/2022 10  <18 ng/L Final   Comment: (NOTE) Elevated high sensitivity troponin I (hsTnI) values and significant  changes across serial measurements may suggest ACS but many other  chronic and acute conditions are known to elevate hsTnI results.  Refer to the Links section for chest pain algorithms and additional  guidance. Performed at [facility], [address], [place], [place] [address]    Troponin I (High Sensitivity) 12/09/2022 11  <18 ng/L Final   Comment: (NOTE) Elevated high sensitivity troponin I (hsTnI) values and significant  changes across serial measurements may suggest ACS but many other  chronic and acute conditions are known to elevate hsTnI results.  Refer to the Links section for chest pain algorithms and additional  guidance. Performed at [facility], [address] [place],  [place] [address]    HIV-1 P24 Antigen - HIV24 12/09/2022 NON REACTIVE  NON REACTIVE Final   Comment: (NOTE) Detection of p24 may be inhibited by biotin in the sample, causing false negative results in acute infection.    HIV 1/2 Antibodies 12/09/2022 NON REACTIVE  NON REACTIVE Final   Interpretation (HIV Ag Ab) 12/09/2022 A non  reactive test result means that HIV 1 or HIV 2 antibodies and HIV 1 p24 antigen were not detected in the specimen.   Final   Comment: RESULT CALLED TO, READ BACK BY AND VERIFIED WITH: GRAY,S AT 2309 ON 12/09/22 BY LUZOLOP Performed at [facility], [address], [place], [place] [address]     Blood Alcohol level:  Lab Results  Component Value Date   [facility] <10 05/13/2023   ETH <10 10/20/2022    Metabolic Disorder Labs: Lab Results  Component Value Date   HGBA1C 5.8 (H) 05/13/2023   MPG 119.76 05/13/2023   MPG 105.41 10/20/2022   No results found for: PROLACTIN Lab Results  Component Value Date   CHOL 196 05/13/2023   TRIG 155 (H) 05/13/2023   HDL 61 05/13/2023   CHOLHDL 3.2 05/13/2023   VLDL 31 05/13/2023   LDLCALC 104 (H) 05/13/2023   LDLCALC 140 (H) 10/20/2022    Therapeutic Lab Levels: No results found for: LITHIUM No results found for: VALPROATE No results found for: CBMZ  Physical Findings   PHQ2-9    Flowsheet Row ED from 05/12/2023 in [facility] ED from 10/20/2022 in [facility]  PHQ-2 Total Score 6 0  PHQ-9 Total Score 16 2      Flowsheet Row ED from 05/12/2023 in [facility] Most recent reading at 05/12/2023 11:28 PM ED from 05/12/2023 in [facility] Most recent reading at 05/12/2023  8:09 PM ED from 12/09/2022 in [facility] Emergency Department at [facility] Most recent reading at 12/09/2022  5:58 PM  C-SSRS RISK CATEGORY No Risk No Risk No Risk        Musculoskeletal  Strength & Muscle Tone: within normal limits Gait & Station: normal Patient leans: N/A  Psychiatric Specialty Exam  Presentation  General Appearance:  Appropriate for Environment  Eye Contact: Good  Speech: Clear and Coherent  Speech Volume: Normal  Handedness: Right   Mood and Affect   Mood: Depressed  Affect: Tearful   Thought Process  Thought Processes: Coherent  Descriptions of Associations:Intact  Orientation:Full (Time, Place and Person)  Thought Content:WDL     Hallucinations: denies Ideas of Reference:None  Suicidal Thoughts:No data recorded  Homicidal Thoughts:No data recorded   Sensorium  Memory: Immediate Good; Recent Good; Remote Good  Judgment: Fair  Insight: Good   Executive Functions  Concentration: Good  Attention Span: Good  Recall: Good  Fund of Knowledge: Good  Language: Good   Psychomotor Activity  Psychomotor Activity: No data recorded   Assets  Assets: Communication Skills; Desire for Improvement; Housing; Physical Health; Social Support   Sleep  Sleep: No data recorded   No data recorded   Physical Exam  Physical Exam Vitals reviewed.  Constitutional:      General: He is not in acute distress.    Appearance: He is not toxic-appearing.  HENT:     Head: Normocephalic and atraumatic.     Mouth/Throat:     Mouth: Mucous membranes are moist.     Pharynx: Oropharynx is clear.  Pulmonary:  Effort: Pulmonary effort is normal.  Neurological:     General: No focal deficit present.     Mental Status: He is alert and oriented to person, place, and time.     Gait: Gait normal.    Review of Systems  Cardiovascular:  Negative for chest pain and palpitations.  Gastrointestinal:  Negative for abdominal pain, diarrhea, nausea and vomiting.  Musculoskeletal:  Positive for back pain.  Neurological:  Negative for dizziness and headaches.   Blood pressure 123/71, pulse 66, temperature 97.7 F (36.5 C), temperature source Oral, resp. rate 18, SpO2 100%. There is no height or weight on file to calculate BMI.  Treatment Plan Summary: Daily contact with patient to assess and evaluate symptoms and progress in treatment and Medication management  Rosevelt Luu is a 53 year old male with no past  psychiatric history who was admitted to the [facility] seeking substance abuse treatment and detox. Patient is motivated for residential substance use treatment. He declines psychotropics today. LCSW assisting with dispo planning.  Will discuss naltrexone or other MAT for alcohol use disorder and opiate use disorder once we have obtained labs for him.   #Cocaine Use Disorder #Alcohol Use Disorder #Opiate Use Disorder -Continue CIWA monitoring -Continue Ativan  1 mg PRN for CIWA >10 -Continue Zofran  4 mg every 6 hours as needed for nausea or vomiting. -Continue Imodium  2 to 4 mg as needed for diarrhea or loose stools for 72 hours.  -Continue hydroxyzine  25 mg every 6 hours as needed for anxiety -Continue thiamine  100 mg daily. -Continue multivitamin with minerals daily.   Dispo: seeking residential rehab. LCSW to assist with dispo  Prentice Espy, MD 05/14/2023 10:52 AM

## 2023-05-14 NOTE — ED Notes (Signed)
 Patient remains asleep in bed.  No issues.

## 2023-05-14 NOTE — ED Notes (Signed)
Patient is asleep in bed without issue.

## 2023-05-15 ENCOUNTER — Other Ambulatory Visit: Payer: Self-pay

## 2023-05-15 ENCOUNTER — Encounter (HOSPITAL_COMMUNITY): Payer: Self-pay

## 2023-05-15 ENCOUNTER — Emergency Department (HOSPITAL_COMMUNITY)
Admission: EM | Admit: 2023-05-15 | Discharge: 2023-05-15 | Disposition: A | Discharge status: Home / Self Care | Payer: Self-pay | Attending: Emergency Medicine | Admitting: Emergency Medicine

## 2023-05-15 DIAGNOSIS — Z20822 Contact with and (suspected) exposure to covid-19: Secondary | ICD-10-CM | POA: Insufficient documentation

## 2023-05-15 DIAGNOSIS — R519 Headache, unspecified: Secondary | ICD-10-CM | POA: Insufficient documentation

## 2023-05-15 LAB — RESP PANEL BY RT-PCR (RSV, FLU A&B, COVID)  RVPGX2
Influenza A by PCR: NEGATIVE
Influenza B by PCR: NEGATIVE
Resp Syncytial Virus by PCR: NEGATIVE
SARS Coronavirus 2 by RT PCR: NEGATIVE

## 2023-05-15 MED ORDER — ACETAMINOPHEN 325 MG PO TABS
650.0000 mg | ORAL_TABLET | Freq: Once | ORAL | Status: AC
  Administered 2023-05-15: 650 mg via ORAL
  Filled 2023-05-15: qty 2

## 2023-05-15 MED ORDER — IBUPROFEN 200 MG PO TABS
600.0000 mg | ORAL_TABLET | Freq: Once | ORAL | Status: AC
  Administered 2023-05-15: 600 mg via ORAL
  Filled 2023-05-15: qty 3

## 2023-05-15 NOTE — ED Triage Notes (Signed)
 Pt reports lightheadedness esp with standing, chills, and bad headache starting today. Denies sick contacts.

## 2023-05-15 NOTE — ED Provider Notes (Signed)
 Edward Barr EMERGENCY DEPARTMENT AT [facility] Provider Note   CSN: [id number] Arrival date & time: 05/15/23  0301     History  Chief Complaint  Patient presents with   Headache    Edward Barr is a 53 y.o. male.  The history is provided by the patient.  Headache Edward Barr is a 53 y.o. male who presents to the Emergency Department complaining of headache.  He presents to the emergency department for evaluation of bifrontal headache that started around 10 PM with associated nausea.  No associated fever, cough, runny nose, vomiting, diarrhea, head injury.  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  He does not take any medications.  No concern for carbon monoxide exposure.  No tobacco.  Occasional alcohol.  No drug use. Patient thinks that he may be hungry as he has not eaten anything today.  He thinks that may be a trigger for his headache.    Home Medications Prior to Admission medications   Not on File      Allergies    Garlic, Malt, and Pork-derived products    Review of Systems   Review of Systems  Neurological:  Positive for headaches.  All other systems reviewed and are negative.   Physical Exam Updated Vital Signs BP 117/80 (BP Location: Left Arm)   Pulse 63   Temp 97.8 F (36.6 C) (Oral)   Resp 18   Ht 5' 8 (1.727 m)   Wt 68.9 kg   SpO2 99%   BMI 23.11 kg/m  Physical Exam Vitals and nursing note reviewed.  Constitutional:      Appearance: He is well-developed.  HENT:     Head: Normocephalic and atraumatic.  Cardiovascular:     Rate and Rhythm: Normal rate and regular rhythm.  Pulmonary:     Effort: Pulmonary effort is normal. No respiratory distress.  Abdominal:     Palpations: Abdomen is soft.     Tenderness: There is no abdominal tenderness. There is no guarding or rebound.  Musculoskeletal:        General: No tenderness.     Cervical back: Neck supple.  Skin:    General: Skin is warm and dry.   Neurological:     Mental Status: He is alert and oriented to person, place, and time.     Comments: No asymmetry of facial movements.  Visual fields grossly intact.  5 out of 5 strength in all 4 extremities with sensation to light touch intact in all 4 extremities  Psychiatric:        Behavior: Behavior normal.     ED Results / Procedures / Treatments   Labs (all labs ordered are listed, but only abnormal results are displayed) Labs Reviewed  RESP PANEL BY RT-PCR (RSV, FLU A&B, COVID)  [id number]    EKG None  Radiology No results found.  Procedures Procedures    Medications Ordered in ED Medications  ibuprofen  (ADVIL ) tablet 600 mg (has no administration in time range)  acetaminophen  (TYLENOL ) tablet 650 mg (650 mg Oral Given 05/15/23 0352)    ED Course/ Medical Decision Making/ A&P                                 Medical Decision Making Risk OTC drugs.   Patient here for evaluation of atraumatic headache.  He is nontoxic-appearing on evaluation with no focal neurologic deficits.  Current clinical picture is not consistent with subarachnoid hemorrhage, space-occupying lesion, meningitis.  Will treat with oral medications.  Patient also thinks he may be hungry-will provide a meal.  Plan to discharge with outpatient resources and return precautions.        Final Clinical Impression(s) / ED Diagnoses Final diagnoses:  Bad headache    Rx / DC Orders ED Discharge Orders     None         Griselda Norris, MD 05/15/23 [phone]

## 2023-05-21 ENCOUNTER — Encounter (HOSPITAL_COMMUNITY): Payer: Self-pay

## 2023-05-21 ENCOUNTER — Emergency Department (HOSPITAL_COMMUNITY)
Admission: EM | Admit: 2023-05-21 | Discharge: 2023-05-21 | Disposition: A | Discharge status: Home / Self Care | Payer: Self-pay | Attending: Emergency Medicine | Admitting: Emergency Medicine

## 2023-05-21 ENCOUNTER — Other Ambulatory Visit: Payer: Self-pay

## 2023-05-21 DIAGNOSIS — I10 Essential (primary) hypertension: Secondary | ICD-10-CM | POA: Insufficient documentation

## 2023-05-21 DIAGNOSIS — J029 Acute pharyngitis, unspecified: Secondary | ICD-10-CM

## 2023-05-21 LAB — GROUP A STREP BY PCR: Group A Strep by PCR: NOT DETECTED

## 2023-05-21 MED ORDER — LIDOCAINE VISCOUS HCL 2 % MT SOLN: 15.0000 mL | OROMUCOSAL | 0 refills | Status: AC | PRN

## 2023-05-21 MED ORDER — LIDOCAINE VISCOUS HCL 2 % MT SOLN
15.0000 mL | Freq: Once | OROMUCOSAL | Status: AC
  Administered 2023-05-21: 15 mL via OROMUCOSAL
  Filled 2023-05-21: qty 15

## 2023-05-21 NOTE — ED Notes (Signed)
Patient left before AVS was printed and vitals could be collected.

## 2023-05-21 NOTE — Discharge Instructions (Signed)
You may use viscous lidocaine as needed for your sore throat and tongue discomfort.  You may follow-up with ENT specialist if it persists.  Return if you have any concern.

## 2023-05-21 NOTE — ED Triage Notes (Signed)
Pt presents with tongue pain x 3 days and reports a bump on his tongue. Denies any trauma. Pt states the pain is affecting his ability to eat and drink.

## 2023-05-21 NOTE — ED Provider Notes (Signed)
[place] EMERGENCY DEPARTMENT AT [facility] Provider Note   CSN: [id number] Arrival date & time: 05/21/23  1315     History  No chief complaint on file.   Edward Barr is a 53 y.o. male.  The history is provided by the patient and medical records. No language interpreter was used.     53 year old male significant history of polysubstance abuse including cocaine abuse and opiate abuse, who is unhoused, history of hypertension, presenting with complaints of throat irritation.  Patient report for the past 3 days he noticed some discomfort to both his throat and his tongue having increased difficulty swallowing and eating.  He endorsed a weight loss due to not eating much.  He denies any nausea vomiting or diarrhea he denies any shortness of breath denies any trauma.  No history of cancer.  Denies alcohol or tobacco use.  No new sexual partner.  No specific treatment tried at home. No new medication  Home Medications Prior to Admission medications   Not on File      Allergies    Garlic, Malt, and Pork-derived products    Review of Systems   Review of Systems  Constitutional:  Negative for fever.  HENT:  Positive for mouth sores. Negative for dental problem.     Physical Exam Updated Vital Signs BP 121/89 (BP Location: Right Arm)   Pulse (!) 54   Temp 97.8 F (36.6 C)   Resp 16   Ht 5\' 8"  (1.727 m)   Wt 68.5 kg   SpO2 100%   BMI 22.96 kg/m  Physical Exam Vitals and nursing note reviewed.  Constitutional:      General: He is not in acute distress.    Appearance: He is well-developed.     Comments: Patient is sleeping, easily arousable, does not seem to want to engage in conversation but in no acute discomfort.  HENT:     Head: Atraumatic.     Mouth/Throat:     Mouth: Mucous membranes are moist.     Comments: No stridor, no trismus, no desquamation's of skin no tongue lesion or mucosal lesion no tonsillar enlargement or exudates no edema. Eyes:      Conjunctiva/sclera: Conjunctivae normal.  Neck:     Vascular: No carotid bruit.  Cardiovascular:     Rate and Rhythm: Normal rate.  Pulmonary:     Effort: Pulmonary effort is normal.     Breath sounds: Normal breath sounds. No wheezing, rhonchi or rales.  Musculoskeletal:     Cervical back: Normal range of motion and neck supple. No rigidity or tenderness.  Lymphadenopathy:     Cervical: No cervical adenopathy.  Skin:    Findings: No rash.  Neurological:     Mental Status: He is alert.     ED Results / Procedures / Treatments   Labs (all labs ordered are listed, but only abnormal results are displayed) Labs Reviewed  GROUP A STREP BY PCR    EKG None  Radiology No results found.  Procedures Procedures    Medications Ordered in ED Medications  lidocaine (XYLOCAINE) 2 % viscous mouth solution 15 mL (15 mLs Mouth/Throat Given 05/21/23 1837)    ED Course/ Medical Decision Making/ A&P                                 Medical Decision Making Risk Prescription drug management.   BP 128/78  Pulse (!) 57   Temp 98.2 F (36.8 C)   Resp 17   Ht 5\' 8"  (1.727 m)   Wt 68.5 kg   SpO2 100%   BMI 22.96 kg/m   33:53 PM 53 year old male significant history of polysubstance abuse including cocaine abuse and opiate abuse, who is unhoused, history of hypertension, presenting with complaints of throat irritation.  Patient report for the past 3 days he noticed some discomfort to both his throat and his tongue having increased difficulty swallowing and eating.  He endorsed a weight loss due to not eating much.  He denies any nausea vomiting or diarrhea he denies any shortness of breath denies any trauma.  No history of cancer.  Denies alcohol or tobacco use.  No new sexual partner.  No specific treatment tried at home. No new medication  On exam patient is sleeping but arousable.  He appears to be in no acute respiratory discomfort.  He does not want to talk much however he has  normal phonation.  Mouth exam unremarkable no concerning lesion or rash no edema noted.  Neck is supple trachea is midline.  No lymphadenopathy.  Vital sign overall reassuring.  Strep test obtained independent viewed interpreted by me and is negative for strep infection.  Patient given viscous lidocaine for symptom control and he tolerates well.  At this time no acute emergent medical condition identified.  I have low suspicion for esophageal cancer, throat cancer, SJS, canker sores, pharyngitis, or other acute condition.  At this time no acute emergent medical condition identified.  His airway is intact.  Patient discharged with supportive care.  Return precaution given.        Final Clinical Impression(s) / ED Diagnoses Final diagnoses:  Sore throat    Rx / DC Orders ED Discharge Orders          Ordered    lidocaine (XYLOCAINE) 2 % solution  As needed        05/21/23 2026              Fayrene Helper, PA-C 05/21/23 2027    Vanetta Mulders, MD 05/24/23 [id number]
